# Patient Record
Sex: Female | Born: 1965 | Race: White | Hispanic: No | Marital: Married | Smoking: Never smoker
Health system: Southern US, Community
[De-identification: ages and names within clinical notes are randomized; demographics above are authoritative.]

## PROBLEM LIST (undated history)

## (undated) DIAGNOSIS — K769 Liver disease, unspecified: Secondary | ICD-10-CM

## (undated) DIAGNOSIS — F32A Depression, unspecified: Secondary | ICD-10-CM

## (undated) DIAGNOSIS — A6920 Lyme disease, unspecified: Secondary | ICD-10-CM

## (undated) DIAGNOSIS — K219 Gastro-esophageal reflux disease without esophagitis: Secondary | ICD-10-CM

## (undated) DIAGNOSIS — M1990 Unspecified osteoarthritis, unspecified site: Secondary | ICD-10-CM

## (undated) DIAGNOSIS — F329 Major depressive disorder, single episode, unspecified: Secondary | ICD-10-CM

## (undated) DIAGNOSIS — F419 Anxiety disorder, unspecified: Secondary | ICD-10-CM

## (undated) HISTORY — DX: Liver disease, unspecified: K76.9

## (undated) HISTORY — DX: Depression, unspecified: F32.A

## (undated) HISTORY — PX: TUBAL LIGATION: SHX77

## (undated) HISTORY — PX: FRACTURE SURGERY: SHX138

## (undated) HISTORY — DX: Lyme disease, unspecified: A69.20

## (undated) HISTORY — PX: BREAST SURGERY: SHX581

## (undated) HISTORY — DX: Gastro-esophageal reflux disease without esophagitis: K21.9

## (undated) HISTORY — DX: Unspecified osteoarthritis, unspecified site: M19.90

## (undated) HISTORY — DX: Anxiety disorder, unspecified: F41.9

---
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
## 2007-06-17 HISTORY — PX: BREAST EXCISIONAL BIOPSY: SUR124

## 2014-11-22 ENCOUNTER — Emergency Department: Payer: Commercial Managed Care - POS

## 2014-11-22 ENCOUNTER — Observation Stay: Payer: Commercial Managed Care - POS

## 2014-11-22 ENCOUNTER — Observation Stay: Payer: Commercial Managed Care - POS | Admitting: Internal Medicine

## 2014-11-22 ENCOUNTER — Other Ambulatory Visit: Payer: Commercial Managed Care - POS

## 2014-11-22 ENCOUNTER — Observation Stay
Admission: EM | Admit: 2014-11-22 | Discharge: 2014-11-23 | Disposition: A | Discharge status: Home / Self Care | Payer: Commercial Managed Care - POS | Attending: Internal Medicine | Admitting: Internal Medicine

## 2014-11-22 DIAGNOSIS — R110 Nausea: Secondary | ICD-10-CM | POA: Insufficient documentation

## 2014-11-22 DIAGNOSIS — G44009 Cluster headache syndrome, unspecified, not intractable: Secondary | ICD-10-CM | POA: Diagnosis present

## 2014-11-22 DIAGNOSIS — R51 Headache: Secondary | ICD-10-CM | POA: Insufficient documentation

## 2014-11-22 DIAGNOSIS — G510 Bell's palsy: Principal | ICD-10-CM | POA: Insufficient documentation

## 2014-11-22 DIAGNOSIS — H5702 Anisocoria: Secondary | ICD-10-CM | POA: Insufficient documentation

## 2014-11-22 DIAGNOSIS — K7689 Other specified diseases of liver: Secondary | ICD-10-CM | POA: Insufficient documentation

## 2014-11-22 DIAGNOSIS — R519 Headache, unspecified: Secondary | ICD-10-CM

## 2014-11-22 DIAGNOSIS — R2990 Unspecified symptoms and signs involving the nervous system: Secondary | ICD-10-CM | POA: Diagnosis present

## 2014-11-22 DIAGNOSIS — F32A Depression, unspecified: Secondary | ICD-10-CM | POA: Diagnosis present

## 2014-11-22 DIAGNOSIS — R740 Nonspecific elevation of levels of transaminase and lactic acid dehydrogenase [LDH]: Secondary | ICD-10-CM | POA: Insufficient documentation

## 2014-11-22 LAB — CBC AND DIFFERENTIAL
Basophils Absolute Automated: 0.01 10*3/uL (ref 0.00–0.20)
Basophils Automated: 0 %
Eosinophils Absolute Automated: 0 10*3/uL (ref 0.00–0.70)
Eosinophils Automated: 0 %
Hematocrit: 41.2 % (ref 37.0–47.0)
Hgb: 13.8 g/dL (ref 12.0–16.0)
Immature Granulocytes Absolute: 0 10*3/uL
Immature Granulocytes: 0 %
Lymphocytes Absolute Automated: 0.43 10*3/uL — ABNORMAL LOW (ref 0.50–4.40)
Lymphocytes Automated: 10 %
MCH: 31 pg (ref 28.0–32.0)
MCHC: 33.5 g/dL (ref 32.0–36.0)
MCV: 92.6 fL (ref 80.0–100.0)
MPV: 11.7 fL (ref 9.4–12.3)
Monocytes Absolute Automated: 0.32 10*3/uL (ref 0.00–1.20)
Monocytes: 8 %
Neutrophils Absolute: 3.38 10*3/uL (ref 1.80–8.10)
Neutrophils: 82 %
Platelets: 161 10*3/uL (ref 140–400)
RBC: 4.45 10*6/uL (ref 4.20–5.40)
RDW: 13 % (ref 12–15)
WBC: 4.14 10*3/uL (ref 3.50–10.80)

## 2014-11-22 LAB — LIPID PANEL
Cholesterol / HDL Ratio: 2
Cholesterol: 129 mg/dL (ref 0–199)
HDL: 65 mg/dL (ref >40)
LDL Calculated: 54 mg/dL (ref 0–99)
Triglycerides: 51 mg/dL (ref 34–149)
VLDL Calculated: 10 mg/dL (ref 10–40)

## 2014-11-22 LAB — GLUCOSE WHOLE BLOOD - POCT: Whole Blood Glucose POCT: 123 mg/dL — ABNORMAL HIGH (ref 70–100)

## 2014-11-22 LAB — TROPONIN I
Troponin I: <0.01 ng/mL (ref 0.00–0.09)
Troponin I: <0.01 ng/mL (ref 0.00–0.09)
Troponin I: 0.01 ng/mL (ref 0.00–0.09)

## 2014-11-22 LAB — APTT
PTT: 25 s (ref 23–37)
PTT: 29 s (ref 23–37)

## 2014-11-22 LAB — ECG 12-LEAD
Atrial Rate: 94 {beats}/min
P Axis: 74 degrees
P-R Interval: 138 ms
Q-T Interval: 340 ms
QRS Duration: 72 ms
QTC Calculation (Bezet): 425 ms
R Axis: 26 degrees
T Axis: -10 degrees
Ventricular Rate: 94 {beats}/min

## 2014-11-22 LAB — CBC
Hematocrit: 38.9 % (ref 37.0–47.0)
Hgb: 13 g/dL (ref 12.0–16.0)
MCH: 30.9 pg (ref 28.0–32.0)
MCHC: 33.4 g/dL (ref 32.0–36.0)
MCV: 92.4 fL (ref 80.0–100.0)
MPV: 11.5 fL (ref 9.4–12.3)
Platelets: 158 10*3/uL (ref 140–400)
RBC: 4.21 10*6/uL (ref 4.20–5.40)
RDW: 13 % (ref 12–15)
WBC: 4.33 10*3/uL (ref 3.50–10.80)

## 2014-11-22 LAB — URINALYSIS, REFLEX TO MICROSCOPIC EXAM IF INDICATED
Bilirubin, UA: NEGATIVE
Glucose, UA: NEGATIVE
Ketones UA: NEGATIVE
Leukocyte Esterase, UA: NEGATIVE
Nitrite, UA: NEGATIVE
Protein, UR: NEGATIVE
Specific Gravity UA: 1.01 (ref 1.001–1.035)
Urine pH: 7 (ref 5.0–8.0)
Urobilinogen, UA: NORMAL mg/dL

## 2014-11-22 LAB — COMPREHENSIVE METABOLIC PANEL
ALT: 120 U/L — ABNORMAL HIGH (ref 0–55)
AST (SGOT): 121 U/L — ABNORMAL HIGH (ref 5–34)
Albumin/Globulin Ratio: 1.5 (ref 0.9–2.2)
Albumin: 4.1 g/dL (ref 3.5–5.0)
Alkaline Phosphatase: 116 U/L — ABNORMAL HIGH (ref 37–106)
Anion Gap: 10 (ref 5.0–15.0)
BUN: 8.3 mg/dL (ref 7.0–19.0)
Bilirubin, Total: 0.3 mg/dL (ref 0.2–1.2)
CO2: 25 mEq/L (ref 22–29)
Calcium: 9.4 mg/dL (ref 8.5–10.5)
Chloride: 104 mEq/L (ref 100–111)
Creatinine: 0.9 mg/dL (ref 0.6–1.0)
Globulin: 2.7 g/dL (ref 2.0–3.6)
Glucose: 120 mg/dL — ABNORMAL HIGH (ref 70–100)
Potassium: 3.9 mEq/L (ref 3.5–5.1)
Protein, Total: 6.8 g/dL (ref 6.0–8.3)
Sodium: 139 mEq/L (ref 136–145)

## 2014-11-22 LAB — HEMOLYSIS INDEX: Hemolysis Index: 1 (ref 0–18)

## 2014-11-22 LAB — PT/INR
PT INR: 0.9
PT INR: 1
PT: 12.7 s (ref 12.6–15.0)
PT: 13.3 s (ref 12.6–15.0)

## 2014-11-22 LAB — HCG, SERUM, QUALITATIVE: Hcg Qualitative: NEGATIVE

## 2014-11-22 LAB — SEDIMENTATION RATE: Sed Rate: 17 mm/Hr (ref 0–20)

## 2014-11-22 LAB — GFR: EGFR: >60

## 2014-11-22 MED ORDER — ONDANSETRON HCL 4 MG/2ML IJ SOLN
4.0000 mg | Freq: Three times a day (TID) | INTRAMUSCULAR | Status: DC | PRN
Start: 2014-11-22 — End: 2014-11-24

## 2014-11-22 MED ORDER — ACETAMINOPHEN 325 MG PO TABS
650.0000 mg | ORAL_TABLET | ORAL | Status: DC | PRN
Start: 2014-11-22 — End: 2014-11-24

## 2014-11-22 MED ORDER — ACETAMINOPHEN 650 MG RE SUPP
650.0000 mg | RECTAL | Status: DC | PRN
Start: 2014-11-22 — End: 2014-11-24

## 2014-11-22 MED ORDER — SUMATRIPTAN SUCCINATE 50 MG PO TABS
50.0000 mg | ORAL_TABLET | Freq: Once | ORAL | Status: AC
Start: 2014-11-22 — End: 2014-11-22
  Administered 2014-11-22: 50 mg via ORAL
  Filled 2014-11-22: qty 1

## 2014-11-22 MED ORDER — IOHEXOL 350 MG/ML IV SOLN
100.0000 mL | Freq: Once | INTRAVENOUS | Status: AC | PRN
Start: 2014-11-22 — End: 2014-11-22
  Administered 2014-11-22: 100 mL via INTRAVENOUS

## 2014-11-22 MED ORDER — MORPHINE SULFATE 4 MG/ML IJ/IV SOLN (WRAP)
4.0000 mg | Freq: Once | Status: AC
Start: 2014-11-22 — End: 2014-11-22
  Administered 2014-11-22: 4 mg via INTRAVENOUS
  Filled 2014-11-22: qty 1

## 2014-11-22 MED ORDER — PROMETHAZINE HCL 25 MG/ML IJ SOLN
12.5000 mg | Freq: Once | INTRAMUSCULAR | Status: AC
Start: 2014-11-22 — End: 2014-11-22
  Administered 2014-11-22: 12.5 mg via INTRAVENOUS
  Filled 2014-11-22: qty 1

## 2014-11-22 MED ORDER — GADOBUTROL 1 MMOL/ML IV SOLN
10.0000 mL | Freq: Once | INTRAVENOUS | Status: AC | PRN
Start: 2014-11-22 — End: 2014-11-22
  Administered 2014-11-22: 10 mmol via INTRAVENOUS

## 2014-11-22 MED ORDER — ACETAMINOPHEN 325 MG PO TABS
650.0000 mg | ORAL_TABLET | ORAL | Status: DC | PRN
Start: 2014-11-22 — End: 2014-11-22

## 2014-11-22 MED ORDER — ONDANSETRON HCL 4 MG/2ML IJ SOLN
4.0000 mg | Freq: Once | INTRAMUSCULAR | Status: DC | PRN
Start: 2014-11-22 — End: 2014-11-24

## 2014-11-22 MED ORDER — ASPIRIN 81 MG PO CHEW
81.0000 mg | CHEWABLE_TABLET | Freq: Every day | ORAL | Status: DC
Start: 2014-11-23 — End: 2014-11-24
  Administered 2014-11-23 (×2): 81 mg via ORAL
  Filled 2014-11-22 (×2): qty 1

## 2014-11-22 MED ORDER — ONDANSETRON 4 MG PO TBDP
4.0000 mg | ORAL_TABLET | Freq: Three times a day (TID) | ORAL | Status: DC | PRN
Start: 2014-11-22 — End: 2014-11-24

## 2014-11-22 MED ORDER — VENLAFAXINE HCL ER 37.5 MG PO CP24
75.0000 mg | ORAL_CAPSULE | Freq: Every day | ORAL | Status: DC
Start: 2014-11-22 — End: 2014-11-24
  Administered 2014-11-22: 75 mg via ORAL
  Filled 2014-11-22 (×4): qty 2

## 2014-11-22 MED ORDER — ZOLPIDEM TARTRATE 5 MG PO TABS
5.0000 mg | ORAL_TABLET | Freq: Every evening | ORAL | Status: DC | PRN
Start: 2014-11-22 — End: 2014-11-24

## 2014-11-22 MED ORDER — SODIUM CHLORIDE 0.9 % IV SOLN
INTRAVENOUS | Status: DC
Start: 2014-11-22 — End: 2014-11-23

## 2014-11-22 MED ORDER — IOHEXOL 240 MG/ML IJ SOLN
50.0000 mL | Freq: Once | INTRAMUSCULAR | Status: DC | PRN
Start: 2014-11-22 — End: 2014-11-22

## 2014-11-22 MED ORDER — SODIUM CHLORIDE 0.9 % IV SOLN
INTRAVENOUS | Status: AC
Start: 2014-11-22 — End: 2014-11-23

## 2014-11-22 MED ORDER — GADOBUTROL 1 MMOL/ML IV SOLN
10.0000 mL | Freq: Once | INTRAVENOUS | Status: DC | PRN
Start: 2014-11-22 — End: 2014-11-24

## 2014-11-22 NOTE — Consults (Signed)
Service Date: 11/22/2014     Patient Type: V     CONSULTING PHYSICIAN: Everlean Patterson MD     REFERRING PHYSICIAN:      HISTORY OF PRESENT ILLNESS:  The patient is a 49 year old I was asked to see in consultation for  neurological evaluation.  The patient presented with headache, also she  stated that she was feeling fatigued and sore, tiredness.  She does notice  an asymmetric pupil.  The headache was primarily left-sided frontotemporal.   These headaches were gradual onset and got worse.  In the morning, the  patient noticed that her right pupil was much larger than the left pupil.   She denies any double vision.  She had mild nausea, mild photophobia, but  no vomiting.  She stated she is feeling achy with some chills for the past  couple of days.  She denies any definite tick bites.     She denies any numbness, weakness of extremities.     PAST MEDICAL HISTORY:  Positive for some headache off and on, but according to patient, these  headaches are quite different.     PAST SURGICAL HISTORY:  Denies any major surgery.     FAMILY HISTORY:  No neurological disorder.     SOCIAL HISTORY:  Denies any smoking, drinking, drugs, or alcohol use.     REVIEW OF SYSTEMS:  Fatigue with some soreness and achy feeling, some myalgias.  Headache.  No  double vision.  No loss of consciousness.  No convulsions, seizure.     PHYSICAL EXAMINATION:  VITAL SIGNS:  Blood pressure was 140/66, heart rate was 92, temperature was  97, respiratory rate 16.    GENERAL:  Otherwise, very pleasant lady who is awake, alert, and oriented  in time, person and place.  Somewhat in distress because of the headache  which is primarily left frontotemporal.    NEUROLOGICAL:  Speech was clear.  Comprehension was normal.  CARDIOVASCULAR:  Normal first and second heart sounds.  CHEST:  Chest moves symmetrical.  ABDOMEN:  Soft.    NECK:  No carotid bruit, no neck stiffness.     Right pupil was about 5 mm, left about 3 mm.  Both pupils, however, were  reactive to  light.  Mild droopiness of left eyelid.  Otherwise, no  nystagmus.  Extraocular movements are fully intact.  No facial weakness.   No other cranial nerve deficit.     DIAGNOSTIC STUDIES:  EKG:  Normal sinus rhythm.  White cell count 4.3, hemoglobin 13, hematocrit  38, platelets 158.     Cholesterol 129, HDL 65, triglycerides 51, troponin less than 0.01.   Sedimentation rate is 17.  PT 13, INR 1, PTT 29.  Reviewed a CT scan of the  head.  There was a small area of hypodensity reported in the posterior pons  on the right side.  This was nonspecific.  CTA was done to rule out an  aneurysm.  There was only 1 mm outpouching extending superiorly from  anterior communicating artery.  No posterior communicating artery aneurysm  seen.     This was followed by MRI of the brain, which is negative.  MRA was also  done, which is negative, and MR angiogram.     ASSESSMENT AND PLAN:  A 49 year old with the above-mentioned symptoms.  Mild asymmetric pupil,  left smaller than the right, both react.  Extraocular movements intact.   Some soreness, achy feeling, not sure if some viral syndrome.  No  definitive tick, but may check Lyme.  Otherwise, no evidence of any  significant aneurysm.  Most likely she is suffering from complicated  migraine or cluster migraine headache that may benefit from triptan at this  time.  Otherwise, she appears stable from neurological standpoint, consider  outpatient followup.           D:  11/22/2014 21:03 PM by Dr. Br[place] ([id number])  T:  11/22/2014 21:39 PM by       Everlean Cherry: [id number]) (Doc ID: [id number])

## 2014-11-22 NOTE — Plan of Care (Signed)
Problem: Safety  Goal: Patient will be free from injury during hospitalization  Outcome: Progressing  Pt verbalized understanding to use call bell before getting oob, non skid socks in place, call bell within reach.

## 2014-11-22 NOTE — ED Notes (Signed)
BG 123

## 2014-11-22 NOTE — Discharge Summary -  Nursing (Signed)
Ask3Teach3 Program    Education about New Medications and their Side effects    Dear Nicole Holder,    Its been a pleasure taking care of you during your hospitalization here at [facility]. We have initiated a new program to educate our patients and/or their family members or designated personnel about the new medications started by your physicians and their indications along with the possible side effects. Multiple studies have shown that patients started on new medications are often unaware of the names of the medication along with the indications and their side effects which leads to decreased compliance with the medications.    During our conversation today on 11/22/2014  4:04 PM I have explained to you the name of the new medication and the indication along with some possible common side effects. Listed below are some of the new medications started during this hospitalization.     Please call the Nurse if you have any side effects while in hospital.     Please call [phone] if you have any life threatening symptoms after you are discharged from the hospital.    Please inform your Primary care physician for common side effects which are not life threatening after discharge.    Medication: Levofloxacin(Levaquin)   This Medication is used for:   Bacterial Infections    Common Side Effects are:   Nausea   Diarrhea   Upset Stomach    A note from your nurse:  Call your nurse immediately if you notice itching, hives, swelling or trouble breathing       Thank you for your time.    Vaughan Sine, RN  11/22/2014  4:04 PM  [facility]  [address]  [place], [place]  [address]

## 2014-11-22 NOTE — ED Provider Notes (Signed)
Physician/Midlevel provider first contact with patient: 11/22/14 1610         History   No chief complaint on file.    HPI Comments: 49 year old female presents to the emergency department complaining of a left-sided frontal headache since yesterday.  Gradual onset.  Gradually worse.  This morning she noticed that her right pupil was much larger than her left pupil.  The pain is 4 out of 10.  She said she never gets headaches.  She has mild nausea.  Mild light sensitivity.  No vomiting.  She has had chills and felt achy for 2 days.  She says they also rescued a cardinal 4 days ago, but had no bite or other exposure to it.  No fevers or chills.  No weakness or numbness.    The history is provided by the patient and the spouse.            Past Medical History   Diagnosis Date   . Disorder of liver    . Depression        History reviewed. No pertinent past surgical history.    Family History   Problem Relation Age of Onset   . Cancer Mother        Social  History   Substance Use Topics   . Smoking status: Never Smoker    . Smokeless tobacco: Never Used   . Alcohol Use: No      Comment: social       .     No Known Allergies    Home Medications     Last Medication Reconciliation Action:  Pharmacy Completed [facility] Shown 11/22/2014  1:30 PM          Status Comment           11/22/2014  1:30 PM      Pharmacy Reviewed                      calcium carbonate (TUMS) 500 MG chewable tablet     Chew 2 tablets by mouth daily.     Cholecalciferol (VITAMIN D3 PO)     Take 6,000 mg by mouth daily.     Magnesium 500 MG Tab     Take 1 tablet by mouth daily.     venlafaxine (EFFEXOR-XR) 75 MG 24 hr capsule     Take 75 mg by mouth daily.     zolpidem (AMBIEN) 5 MG tablet     Take 5 mg by mouth nightly as needed for Sleep.                               Review of Systems   Constitutional: Positive for chills and fatigue. Negative for fever.   HENT: Negative for congestion, rhinorrhea and sore throat.    Respiratory: Negative for  cough, chest tightness and shortness of breath.    Cardiovascular: Negative for chest pain and palpitations.   Gastrointestinal: Negative for nausea, vomiting, abdominal pain and diarrhea.   Genitourinary: Negative for dysuria and frequency.   Musculoskeletal: Positive for myalgias. Negative for back pain.   Skin: Negative for color change and rash.   Neurological: Positive for headaches. Negative for dizziness.   Psychiatric/Behavioral: Negative for confusion. The patient is not nervous/anxious.        Physical Exam    BP: 141/66 mmHg, Heart Rate: 92, Temp: 97.3 F (36.3 C), Resp Rate: 16, SpO2: 100 %,  Weight: 72.576 kg    Physical Exam   Constitutional: She is oriented to person, place, and time. She appears well-developed and well-nourished.   HENT:   Head: Normocephalic and atraumatic.   Eyes: Conjunctivae and EOM are normal.   Right pupil approximately 8 mm, left pupil approximately 4 mm, both responsive directly and indirectly   Neck: Normal range of motion. Neck supple.   Cardiovascular: Normal rate, regular rhythm and normal heart sounds.    Pulmonary/Chest: Effort normal and breath sounds normal. No respiratory distress. She has no wheezes. She has no rales.   Abdominal: Soft. She exhibits no distension. There is no tenderness. There is no rebound and no guarding.   Musculoskeletal: Normal range of motion. She exhibits no edema or tenderness.   Neurological: She is alert and oriented to person, place, and time. No cranial nerve deficit.   Skin: Skin is warm and dry.   Psychiatric: She has a normal mood and affect. Her behavior is normal. Judgment and thought content normal.   Nursing note and vitals reviewed.        MDM and ED Course     ED Medication Orders     Start Ordered     Status Ordering Provider    11/22/14 1059 11/22/14 1059  morphine injection 4 mg   Once     Route: Intravenous  Ordered Dose: 4 mg     Last MAR action:  Given Laykin Rainone H    11/22/14 1059 11/22/14 1059  promethazine (PHENERGAN)  injection 12.5 mg   Once     Route: Intravenous  Ordered Dose: 12.5 mg     Last MAR action:  Given Jacee Enerson H             MDM  Number of Diagnoses or Management Options  Anisocoria:   New onset headache:   Diagnosis management comments: Differential diagnosis: Bleed, stroke, mass, cranial nerve injury  Plan: Stat CAT scan, labs, admission for MRI    I, Nita Sells, M.D, have been the primary provider for this patient during this Emergency Dept visit.    Oxygen saturation by pulse oximetry is 95%-100%, Normal.  Interventions: None Needed    EKG Interpretation by Nita Sells, MD, ED physician:  Rate:  Normal  Rhythm:  Normal Sinus  Axis:  Normal  Conduction:  No blocks  ST Segments:  Non specific   Other findings:  none  Q Waves:  v1 v2  Clinical Impression:  Non-specific EKG  Comparison to old ECG none    I had a stat d/w Dr. Marney Doctor from neuorlogy who wanted a stat CTA    I d/w neuro-radiologist. MRI available in likely one hour but by the time I got results, would likely be 3 hour delay. Will do immediate cta and if neg, will admit for MRI. Neurorads suggests no neck cta and wait for MRA of neck.    I d/w Dr. Marney Doctor the CTA results. He saw her in the ER. He thinks she is likely having an atypical migraine. Agrees with pain meds and admission for MRI. He feels cta findings do not explain the anisocoria.    I d/w hospitalist who will admit for MRI. Pt improved.         Amount and/or Complexity of Data Reviewed  Clinical lab tests: ordered and reviewed  Tests in the radiology section of CPT: ordered and reviewed  Obtain history from someone other than the patient: yes (husband)  Discuss the patient  with other providers: yes (See above)    Risk of Complications, Morbidity, and/or Mortality  Presenting problems: high  Diagnostic procedures: high  Management options: high    Critical Care  Total time providing critical care: 30-74 minutes    Patient Progress  Patient progress: improved            Stroke/tPA Management  Date/Time:  11/24/2014 11:58 AM  Performed by: Nita Sells H  Authorized by: Zada Finders  Stroke/tPA: Best estimate of time since last normal exceeds the recommended window for tPA treatment..  Critical Care  Performed by: Leticia Clas  Authorized by: Zada Finders    Critical care provider statement:     Critical care time (minutes):  35    Critical care time was exclusive of:  Separately billable procedures and treating other patients    Critical care was necessary to treat or prevent imminent or life-threatening deterioration of the following conditions:  CNS failure or compromise    Critical care was time spent personally by me on the following activities:  Obtaining history from patient or surrogate, development of treatment plan with patient or surrogate, discussions with consultants, evaluation of patient's response to treatment, examination of patient, ordering and performing treatments and interventions, ordering and review of laboratory studies, ordering and review of radiographic studies and re-evaluation of patient's condition      Clinical Impression & Disposition     Clinical Impression  Final diagnoses:   Anisocoria   New onset headache        ED Disposition     Observation Admitting Physician: ADI REDDY, SWAPNA [[id number]]  Diagnosis: Stroke-like symptoms [711733]  Estimated Length of Stay: < 2 midnights  Tentative Discharge Plan?: Home or Self Care [1]  Patient Class: Observation [104]             Discharge Medication List as of 11/23/2014  7:25 PM      START taking these medications    Details   acyclovir (ZOVIRAX) 800 MG tablet Take 1 tablet (800 mg total) by mouth 2 (two) times daily., Starting 11/23/2014, Until Tue 11/28/14, Print      ondansetron (ZOFRAN) 4 MG tablet Take 1 tablet (4 mg total) by mouth every 8 (eight) hours as needed for Nausea., Starting 11/23/2014, Until Discontinued, Print      pantoprazole (PROTONIX) 40 MG tablet Take 1 tablet (40 mg total) by mouth daily., Starting 11/23/2014, Until Sun 12/03/14,  Print      predniSONE (DELTASONE) 10 MG tablet Take 6 tablets for 5 days then 5 tabs for 1 day then 4 tabs for 1 day then 3 tabs for 1 day then 2 tabs for 1 day then 1 tab for 1 day., Print      SUMAtriptan (IMITREX) 25 MG tablet Take 1 tablet (25 mg total) by mouth every 6 (six) hours as needed for Migraine., Starting 11/23/2014, Until Discontinued, Print                         Leticia Clas, MD  11/24/14 [phone]

## 2014-11-22 NOTE — Plan of Care (Signed)
Problem: Health Promotion  Goal: Vaccination Screening  All patients will be screened for current vaccination status on each admission.   Outcome: Progressing  Goal: Knowledge - disease process  Extent of understanding conveyed about a specific disease process.   Outcome: Progressing  Goal: Risk control - tobacco abuse  Actions to eliminate or reduce tobacco use.   Outcome: Progressing  Goal: Knowledge - health resources  Extent of understanding and conveyed about healthcare resources.   Outcome: Progressing    Problem: Safety  Goal: Patient will be free from injury during hospitalization  Outcome: Progressing    Problem: Pain  Goal: Patient's pain/discomfort is manageable  Outcome: Progressing    Problem: Psychosocial and Spiritual Needs  Goal: Demonstrates ability to cope with hospitalization/illness  Outcome: Progressing    Problem: Day of Admission- Stroke  Goal: Neurological status is stable or improving  Outcome: Progressing  Goal: Stable vital signs and fluid balance  Outcome: Progressing  Goal: Patient will maintain Adequate Oxygenation  Outcome: Progressing  Goal: Patients risk of aspiration will be minimized  Outcome: Progressing  Goal: Nutritional Status Improving  Outcome: Progressing  Goal: Elimination patterns are normal or improving  Outcome: Progressing  Goal: Mobility/activity is maintained at optimum level for patient  Outcome: Progressing  Goal: Skin integrity is maintained or improved  Outcome: Progressing  Goal: Neurovascular Status is Stable or Improving  Outcome: Progressing  Goal: Effective coping demonstrated  Outcome: Progressing  Goal: Will be able to express needs and understand communication  Outcome: Progressing  Goal: Patient/Patient Care Companion demonstrates understanding on disease process, treatment plan, medications and discharge plan and consequences of noncompliance.  Outcome: Progressing

## 2014-11-22 NOTE — H&P (Signed)
[facility] HOSPITALIST  H&P    Patient Info:   Date Time: 11/22/2014  2:14 PM   Patient Name:Nicole Holder   ZOX:[id number]    PCP: Moishe Spice, MD   Admit Date:11/22/2014   Attending Physician:Adi Viona Gilmore, MD      Assessment and Plan:   1. Strokelike symptoms, headache, and anisocoria.  CT scan of the head  showed a small hypodensity in the posterior pons on the right side. Nonspecific and could represent a small chronic infarction. For this reason, a CTA of the head was performed. The CT angiogram shows that the previously. Question abnormality in the right posterior pons does not demonstrate enhancement, nonspecific and could be artifactual. However, there is a 1 millimeter outpouching suspicious for small aneurysm or an infundibulum at the takeoff of a vessel which is below CT angiographic resolution. The proximal visualized portion of the anterior, middle and posterior cerebral arteries are otherwise unremarkable.   Will check MRI of the head with and without contrast along with MRA of the neck .  Stroke protocol   Neurology consultation, Dr. Marney Doctor.  Telemetry monitoring   Check sedimentation rate  Aspirin therapy.  Check lipids.  Check hemoglobin A1c    2. Transaminitis , acute .  Patient reports full physical in December and labs were normal.  Check abdominal ultrasound, specifically liver.     3. Depression, chronic and well controlled.  Continue Effexor .       Hospital Problems:  Principal Problem:    Stroke-like symptoms  Active Problems:    Anisocoria    New onset headache    Depression     DVT Prohylaxis:SEDs and ambulating    Code Status: Full Code   Disposition:home   Condition on admission and Prognosis: Stable    Type of Admission:Observation   Estimated Length of Stay (including stay in the ER receiving treatment): Less than 2 midnights    Medical Necessity for stay: Strokelike symptoms , anisocoria and headache        Clinical Presentation   History of Presenting Illness:    Nicole Holder is a 49 y.o. female who has history of History reviewed. No pertinent past surgical history. Past Medical History   Diagnosis Date   . Disorder of liver    . Depression       Mr.  Gamarra is a very pleasant 49 year old Caucasian female with past medical history of depression who presents with strokelike symptoms. The patient reports that she began her menstrual cycle, 3 days ago and around the same time developed a frontal headache. The headache has been ongoing despite taking naproxen and this morning she woke up with unequal pupils. She has never experienced symptoms like this before and is typically in a good state of health. She has no other neurologic symptoms and presents for evaluation of unequal pupils. The patient was found to have incidental elevated liver enzymes and reports that she was seen by her primary care physician in December for routine physical. At that time all of her labs were good.    She denies difficulty communicating, ambulating, and denies dizziness, lightheadedness, visual changes , gait abnormality , chest pain, shortness of breath, abdominal pain, nausea, vomiting, diarrhea, constipation, or urinary symptoms. She is not taking any over-the-counter medications , does not smoke and drinks alcohol only socially.      Review of Systems:    Items that are highlighted in BOLD are positive:     Constitutional: negative for  anorexia, chills, fatigue, fevers, malaise, night sweats, sweats and weight loss  Eyes: Anisocoria negative for cataracts, color blindness, contacts/glasses, glaucoma, icterus, irritation, redness and visual disturbance, photophobia  Ears, nose, mouth, throat, and face: negative for ear drainage, earaches, epistaxis, facial trauma, hearing loss, hoarseness, nasal congestion, snoring, sore mouth, sore throat, tinnitus and voice change, dysphagia, no hearing devices in use.   Respiratory: negative for asthma, chronic bronchitis, cough, dyspnea on exertion,  emphysema, hemoptysis, pleurisy/chest pain, pneumonia, sputum, stridor and wheezing, prior intubations  Cardiovascular: negative for chest pain, chest pressure/discomfort, claudication, dyspnea, exertional chest pressure/discomfort, fatigue, irregular heart beat, lower extremity edema, near-syncope, orthopnea, palpitations, paroxysmal nocturnal dyspnea, exercise intolerance syncope and tachypnea  Gastrointestinal: negative for abdominal pain, change in bowel habits, constipation, diarrhea, dyspepsia, dysphagia, jaundice, melena, nausea, odynophagia, reflux symptoms, hematochezia and vomiting  Genitourinary: negative for decreased stream, dysuria, frequency, hematuria, hesitancy, nocturia and urinary incontinence  Hematologic/lymphatic: negative for bleeding, easy bruising, lymphadenopathy and petechiae  Musculoskeletal: negative for arthralgias, back pain, bone pain, muscle weakness, myalgias, neck pain and stiff joints  Neurological: negative for coordination problems, dizziness, gait problems, headaches, memory problems, paresthesia, seizures, speech problems, tremors, vertigo and weakness  Behavioral/Psych: negative for abusive relationship, anxiety, depression, excessive alcohol consumption, fatigue, illegal drug usage, mood swings and tobacco use  Endocrine: negative for diabetic symptoms including blurry vision, increased fatigue, polydipsia, polyphagia, polyuria, poor wound healing, pruritus, skin dryness and weight loss and temperature intolerance  Allergic/Immunologic: negative for anaphylaxis, angioedema, hay fever and urticaria       Vitals:   Vitals reviewed height is 1.702 m (5\' 7" ) and weight is 72.576 kg (160 lb). Her temporal artery temperature is 98 F (36.7 C). Her blood pressure is 123/65 and her pulse is 92. Her respiration is 18 and oxygen saturation is 99%. Body mass index is 25.05 kg/(m^2).  Filed Vitals:    11/22/14 1214 11/22/14 1230 11/22/14 1300 11/22/14 1400   BP: 124/70 121/70 113/69  123/65   Pulse: 86 84 90 92   Temp:    98 F (36.7 C)   TempSrc:    Temporal Artery   Resp: 17 17 17 18    Height:    1.702 m (5\' 7" )   Weight:    72.576 kg (160 lb)   SpO2: 98% 99% 97% 99%     Intake and Output Summary (Last 24 hours) at Date Time No intake or output data in the 24 hours ending 11/22/14 1414   Physical Exam:     Items that are highlighted in BOLD are positive:     General Appearance:    Alert, cooperative, no distress, appears stated age   Head:    Normocephalic, without obvious abnormality, atraumatic   Eyes:    anisocoria; right pupil is approximately 5 millimeters while the left is approximately 3 millimeters. Post respond equally to light and accommodation. conjunctiva/corneas clear, EOM's intact,   Ears:    Normal  external ear canals bilaterally. No obvious hearing loss.   Nose:   Nares normal, septum midline, mucosa normal, no drainage    or sinus tenderness   Throat:   Lips, mucosa, and tongue normal; teeth and gums normal   Neck:   Supple, symmetrical, trachea midline, no adenopathy;     thyroid:  no enlargement/tenderness/nodules; no carotid    bruit or JVD   Back:     Symmetric, no curvature, ROM normal, no CVA tenderness   Lungs:     Clear to auscultation bilaterally, respirations  unlabored   Chest [place]:    No tenderness or deformity    Heart:    Regular rate and rhythm, S1 and S2 normal, no murmur, rub   or gallop   Abdomen:     Soft, non-tender, bowel sounds active all four quadrants,     no masses, no organomegaly   Extremities:   Extremities normal, atraumatic, no cyanosis or edema   Pulses:   2+ and symmetric all extremities   Skin:   Skin color, texture, turgor normal, no rashes or lesions   Lymph nodes:   Cervical, supraclavicular, and axillary nodes normal   Neurologic:   CNII-XII intact, normal strength, sensation and reflexes     throughout              Clinical Information   Chief Complaint:No chief complaint on file.     Past Medical History:  Past Medical History    Diagnosis Date   . Disorder of liver    . Depression       Past Surgical History:History reviewed. No pertinent past surgical history.   Family History:  Family History   Problem Relation Age of Onset   . Cancer Mother       Social History:  History   Alcohol Use No     Comment: social     History   Drug Use No     History   Smoking status   . Never Smoker    Smokeless tobacco   . Never Used     History     Social History   . Marital Status: Married     Spouse Name: N/A   . Number of Children: N/A   . Years of Education: N/A     Social History Main Topics   . Smoking status: Never Smoker    . Smokeless tobacco: Never Used   . Alcohol Use: No      Comment: social   . Drug Use: No   . Sexual Activity: Not on file     Other Topics Concern   . None     Social History Narrative   . None      Allergies:No Known Allergies   Medications:  Prescriptions prior to admission   Medication Sig Dispense Refill Last Dose   . calcium carbonate (TUMS) 500 MG chewable tablet Chew 2 tablets by mouth daily.   11/21/2014 at 2100   . Cholecalciferol (VITAMIN D3 PO) Take 6,000 mg by mouth daily.   11/21/2014 at 2100   . Magnesium 500 MG Tab Take 1 tablet by mouth daily.   11/21/2014 at 2100   . naproxen sodium (ANAPROX) 550 MG tablet Take 550 mg by mouth as needed (for cramps).   11/21/2014 at 1400   . Probiotic Product (PROBIOTIC DAILY PO) Take 1 capsule by mouth daily.   11/21/2014 at 2100   . venlafaxine (EFFEXOR-XR) 75 MG 24 hr capsule Take 75 mg by mouth daily.   11/21/2014 at 2100   . zolpidem (AMBIEN) 5 MG tablet Take 5 mg by mouth nightly as needed for Sleep.   11/21/2014 at 2100          Results of Labs/imaging   Labs have been reviewed:   Coagulation Profile:   Recent Labs  Lab 11/22/14  0937   PT 12.7   PT INR 0.9   PTT 25        CBC review:   Recent Labs  Lab 11/22/14  0803  WBC 4.14   HGB 13.8   HEMATOCRIT 41.2   PLATELETS 161   MCV 92.6   RDW 13   NEUTROPHILS 82   LYMPHOCYTES AUTOMATED 10   EOSINOPHILS AUTOMATED 0   IMMATURE GRANULOCYTE  0   NEUTROPHILS ABSOLUTE 3.38   ABSOLUTE IMMATURE GRANULOCYTE 0.00      Chem Review:  Recent Labs  Lab 11/22/14  0937   SODIUM 139   POTASSIUM 3.9   CHLORIDE 104   CO2 25   BUN 8.3   CREATININE 0.9   GLUCOSE 120*   CALCIUM 9.4   BILIRUBIN, TOTAL 0.3   AST (SGOT) 121*   ALT 120*   ALKALINE PHOSPHATASE 116*      Results     Procedure Component Value Units Date/Time    UA with reflex to micro (all hospital ED's and [facility]) [[id number]]  (Abnormal) Collected:  11/22/14 1047    Specimen Information:  Urine Updated:  11/22/14 1106     Urine Type Clean Catch      Color, UA Yellow      Clarity, UA Clear      Specific Gravity UA 1.010      Urine pH 7.0      Leukocyte Esterase, UA Negative      Nitrite, UA Negative      Protein, UR Negative      Glucose, UA Negative      Ketones UA Negative      Urobilinogen, UA Normal mg/dL      Bilirubin, UA Negative      Blood, UA Moderate (A)      RBC, UA 6-10 (A) /hpf      WBC, UA 0-5 /hpf      Squamous Epithelial Cells, Urine 0-5 /hpf      Urine Mucus Present     Troponin I [[id number]] Collected:  11/22/14 0937    Specimen Information:  Blood Updated:  11/22/14 1022     Troponin I <0.01 ng/mL     Comprehensive metabolic panel [[id number]]  (Abnormal) Collected:  11/22/14 0937    Specimen Information:  Blood Updated:  11/22/14 1022     Glucose 120 (H) mg/dL      BUN 8.3 mg/dL      Creatinine 0.9 mg/dL      Sodium 086 mEq/L      Potassium 3.9 mEq/L      Chloride 104 mEq/L      CO2 25 mEq/L      Calcium 9.4 mg/dL      Protein, Total 6.8 g/dL      Albumin 4.1 g/dL      AST (SGOT) 578 (H) U/L      ALT 120 (H) U/L      Alkaline Phosphatase 116 (H) U/L      Bilirubin, Total 0.3 mg/dL      Globulin 2.7 g/dL      Albumin/Globulin Ratio 1.5      Anion Gap 10.0     GFR [[id number]] Collected:  11/22/14 0937     EGFR >60.0 Updated:  11/22/14 1022    Protime-INR [[id number]] Collected:  11/22/14 0937    Specimen Information:  Blood Updated:  11/22/14 1010     PT 12.7 sec      PT INR 0.9       PT Anticoag. Given Within 48 hrs. None     APTT [[id number]] Collected:  11/22/14 0937     PTT 25 sec Updated:  11/22/14 1010  Beta HCG, Qual, Serum [[id number]] Collected:  11/22/14 0937    Specimen Information:  Blood Updated:  11/22/14 1010     Hcg Qualitative Negative     CBC with differential [[id number]]  (Abnormal) Collected:  11/22/14 0803    Specimen Information:  Blood from Blood Updated:  11/22/14 0958     WBC 4.14 x10 3/uL      Hgb 13.8 g/dL      Hematocrit 78.2 %      Platelets 161 x10 3/uL      RBC 4.45 x10 6/uL      MCV 92.6 fL      MCH 31.0 pg      MCHC 33.5 g/dL      RDW 13 %      MPV 11.7 fL      Neutrophils 82 %      Lymphocytes Automated 10 %      Monocytes 8 %      Eosinophils Automated 0 %      Basophils Automated 0 %      Immature Granulocyte 0 %      Neutrophils Absolute 3.38 x10 3/uL      Abs Lymph Automated 0.43 (L) x10 3/uL      Abs Mono Automated 0.32 x10 3/uL      Abs Eos Automated 0.00 x10 3/uL      Absolute Baso Automated 0.01 x10 3/uL      Absolute Immature Granulocyte 0.00 x10 3/uL     Glucose Whole Blood - POCT [[id number]]  (Abnormal) Collected:  11/22/14 0931     POCT - Glucose Whole blood 123 (H) mg/dL Updated:  57/84/69 6295         Radiology reports have been reviewed:  Radiology Results (24 Hour)     Procedure Component Value Units Date/Time    CT Angiogram Head [[id number]] Collected:  11/22/14 1054    Order Status:  Completed Updated:  11/22/14 1105    Narrative:      History: Headaches, right pupil dilatation.    FINDINGS: Following administration of intravenous contrast contiguous  axial images were obtained through the head according to a CT angiogram  protocol. Then delayed contrast enhanced images of the brain were  obtained. Total of 100 cc of Omnipaque 350 was utilized. Multiplanar and  maximum intensity projection images of the CT angiogram of the head were  performed. 3-D rendering was performed on an independent workstation.  Correlation with a brain CT performed  earlier the same day.    The contrast-enhanced brain CT demonstrates no abnormal enhancement. The  previously seen small oval density in the right posterior pons appears  somewhat linear on the current examination. It is nonspecific, could be  artifactual.    The intracranial internal carotid arteries are normal appearing. The  basilar artery is normal in caliber. The right A1 segment appears  congenitally smaller. The anterior communicating artery appears  minimally bulbous. There is a tiny outpouching extending superiorly from  its right aspect measuring approximately 1 mm suspicious for a very  small aneurysm or an infundibulum at the takeoff of a vessel which is  below CT angiographic resolution. The proximal visualized portions of  the anterior, middle and posterior cerebral arteries are otherwise  unremarkable. The posterior communicating arteries are small as the  posterior cerebral arteries are supplied mostly from the basilar artery.      Impression:      Impression:  1. The previously questioned abnormality in the right  posterior pons  does not demonstrate enhancement. It is nonspecific. Please see the  discussion above.  2. There is a 1 mm outpouching extending superiorly from the anterior  communicating artery complex which could represent a tiny aneurysm or an  infundibulum at the origin of the vessel which is below CT angiographic  resolution. No posterior communicating artery aneurysm is seen.    Terrilee Croak, MD   11/22/2014 11:01 AM      Chest AP Portable [[id number]] Collected:  11/22/14 1008    Order Status:  Completed Updated:  11/22/14 1012    Narrative:      Chest x-ray    HISTORY: Stroke evaluation    Comparison: None    Frontal view of the chest obtained. No focal consolidation within the  lungs. Pulmonary vasculature within normal limits. No definite pleural  effusion or pneumothorax. Cardia mediastinal contour unremarkable.      Impression:       No acute finding.    Sallye Lat, MD    11/22/2014 10:08 AM      CT Head WO Contrast [[id number]] Collected:  11/22/14 0918    Order Status:  Completed Updated:  11/22/14 0925    Narrative:      History: Headache, dilated right pupil.    Findings: Brain CT without intravenous contrast. No comparison studies.    There is a small oval hypodensity in the posterior portion of the right  pons. It is nonspecific, could represent a small chronic infarction.  There is no mass effect, acute intracranial hemorrhage. The ventricular  system and cisterns are normally configured. The visualized paranasal  sinuses and calvarium are unremarkable.      Impression:      Impression: There is a small hypodensity in the posterior pons on the  right side. It is nonspecific, could represent a small chronic  infarction. If clinically indicated MRI could be performed for better  evaluation.    Terrilee Croak, MD   11/22/2014 9:21 AM           EKG: EKG reviewed , sinus rhythm at 94 beats per minute with nonspecific T-wave abnormality. We'll repeat EKG    Procedures:Procedures       Hospitalist   Signed by: Oswald Hillock   11/22/2014 2:14 PM

## 2014-11-22 NOTE — Student Medication History (Signed)
Medication History Interview and Review                                              11/22/2014  1:35 PM    The Best Possible Medication History was completed for [facility] 04/04.    Patient Allergy LIst has been Verified  Patient Medication List has been Updated    Corrections to Home Medications and Allergies      The following ALLERGY and INTOLERANCE clarifications were documented:  N/A    The following Medications were ADDED to the Home Medication List:  Per the patient:  Ambien 10mg , 1/2 tablet nightly   Venlafaxine 75mg  ER capsule, 1 capsule daily  Calcium carbonate 500mg , 2 tablets daily  Vitamin D 6000IU, 1 tablet daily   Magnesium 500mg , 1 tablet daily   Probiotic, 1 capsule daily   Naproxen 550mg , PRN for cramps     The following Medications were REMOVED from the Home Medication List:              N/A    The following Medications required DOSE, FREQUENCY, or FORMULATION changes:  N/A      Medication Knowledge,  Barriers, and Comments     Patients medications are administered by: Self      Patient reports his/her adherence during a normal week as approximately: 100 % of medications taken    Patient reports the following FINANCIAL barriers to medication adherence:  N/A    Patient reports the following LOGISTIC or OTHER barriers to medication adherence:  N/A    ADDITIONAL COMMENTS:   Patient states she took 4 tablets of the naproxen 550mg  yesterday (11/21/14) with her last dose of it around 2pm.     On a scale of 1-5 (5 being very knowledgeable and 1 being completely unaware), what was the patient/caregiver level of familiarity with their home medications?    X  Patient     Caregiver:     X  5 Very knowledgeable; Patient can list all home medications with doses, frequencies, and routes; can give indications.     4 Knowledgeable;  Patient can list all medications and most doses and frequencies; can give indications.     3 Somewhat knowledgeable; Patient can list most medications; may or may not know doses  or indications.     2 Unaware;  Patient can list some medications or knows some indications.     1 Completely Unaware;  Patient can not list any medications or indications of medications.   For patients whose medication knowledge <4, medication information was verified with bottles, caregiver, pharmacy, physician, insurance, or another reliable source.    Information Sources  X  Patient Interview     Electronic PTA Medication List     Medication List     Medication Vials or Bottles     Family or Caregiver Interview     Pharmacy:     Other, please note:       Allergies and Intolerances  Review of patient's allergies indicates no known allergies.      Prior to Administration Medications    (Not in a hospital admission)      Current Hospital Administered Medications              Gearldine Shown [phone]  Student Pharmacist  [facility]

## 2014-11-23 DIAGNOSIS — G510 Bell's palsy: Secondary | ICD-10-CM | POA: Diagnosis not present

## 2014-11-23 LAB — COMPREHENSIVE METABOLIC PANEL
ALT: 84 U/L — ABNORMAL HIGH (ref 0–55)
AST (SGOT): 62 U/L — ABNORMAL HIGH (ref 5–34)
Albumin/Globulin Ratio: 1.3 (ref 0.9–2.2)
Albumin: 3.4 g/dL — ABNORMAL LOW (ref 3.5–5.0)
Alkaline Phosphatase: 100 U/L (ref 37–106)
Anion Gap: 10 (ref 5.0–15.0)
BUN: 6.5 mg/dL — ABNORMAL LOW (ref 7.0–19.0)
Bilirubin, Total: 0.3 mg/dL (ref 0.2–1.2)
CO2: 24 mEq/L (ref 22–29)
Calcium: 8.7 mg/dL (ref 8.5–10.5)
Chloride: 106 mEq/L (ref 100–111)
Creatinine: 0.8 mg/dL (ref 0.6–1.0)
Globulin: 2.6 g/dL (ref 2.0–3.6)
Glucose: 113 mg/dL — ABNORMAL HIGH (ref 70–100)
Potassium: 3.9 mEq/L (ref 3.5–5.1)
Protein, Total: 6 g/dL (ref 6.0–8.3)
Sodium: 140 mEq/L (ref 136–145)

## 2014-11-23 LAB — ECG 12-LEAD
Atrial Rate: 97 {beats}/min
P Axis: 66 degrees
P-R Interval: 142 ms
Q-T Interval: 338 ms
QRS Duration: 74 ms
QTC Calculation (Bezet): 429 ms
R Axis: 24 degrees
T Axis: -8 degrees
Ventricular Rate: 97 {beats}/min

## 2014-11-23 LAB — HEMOGLOBIN A1C: Hemoglobin A1C: 5.4 % (ref 0.0–6.0)

## 2014-11-23 LAB — GLUCOSE CSF: CSF Glucose: 66 mg/dL (ref 40–70)

## 2014-11-23 LAB — PROTEIN, CSF: CSF Protein: 26.1 mg/dL (ref 15.0–40.0)

## 2014-11-23 LAB — CELL COUNT CSF TUBE #3
CSF Neutrophils Tube #3: 0 % (ref 0–6)
CSF RBC Count Tube #3: 0 /mm3 (ref 0–3)
CSF WBC Count Tube #3: 0 /mm3 (ref 0–5)

## 2014-11-23 LAB — GFR: EGFR: >60

## 2014-11-23 MED ORDER — METHYLPREDNISOLONE SODIUM SUCC 125 MG IJ SOLR
125.0000 mg | Freq: Once | INTRAMUSCULAR | Status: AC
Start: 2014-11-23 — End: 2014-11-23
  Administered 2014-11-23: 125 mg via INTRAVENOUS
  Filled 2014-11-23: qty 2

## 2014-11-23 MED ORDER — METHYLPREDNISOLONE SODIUM SUCC 40 MG IJ SOLR
40.0000 mg | Freq: Two times a day (BID) | INTRAMUSCULAR | Status: DC
Start: 2014-11-23 — End: 2014-11-24
  Administered 2014-11-23: 40 mg via INTRAVENOUS
  Filled 2014-11-23: qty 1

## 2014-11-23 MED ORDER — SODIUM CHLORIDE 0.9 % IV MBP
2.0000 g | INTRAVENOUS | Status: DC
Start: 2014-11-23 — End: 2014-11-24
  Administered 2014-11-23: 2 g via INTRAVENOUS
  Filled 2014-11-23: qty 2000

## 2014-11-23 MED ORDER — SODIUM CHLORIDE 0.9 % IV SOLN
10.0000 mg/kg | Freq: Three times a day (TID) | INTRAVENOUS | Status: DC
Start: 2014-11-23 — End: 2014-11-24
  Administered 2014-11-23 (×2): 750 mg via INTRAVENOUS
  Filled 2014-11-23 (×3): qty 15

## 2014-11-23 MED ORDER — PANTOPRAZOLE SODIUM 40 MG PO TBEC
40.0000 mg | DELAYED_RELEASE_TABLET | Freq: Every day | ORAL | Status: AC
Start: 2014-11-23 — End: 2014-12-03

## 2014-11-23 MED ORDER — SODIUM CHLORIDE 0.9 % IV SOLN
INTRAVENOUS | Status: DC
Start: 2014-11-23 — End: 2014-11-24

## 2014-11-23 MED ORDER — DOXYCYCLINE HYCLATE 100 MG PO TABS
100.0000 mg | ORAL_TABLET | Freq: Two times a day (BID) | ORAL | Status: AC
Start: 2014-11-23 — End: 2014-12-14

## 2014-11-23 MED ORDER — METHYLPREDNISOLONE SODIUM SUCC 125 MG IJ SOLR
60.0000 mg | Freq: Three times a day (TID) | INTRAMUSCULAR | Status: DC
Start: 2014-11-23 — End: 2014-11-23

## 2014-11-23 MED ORDER — SODIUM CHLORIDE 0.9 % IV SOLN
10.0000 mg/kg | Freq: Three times a day (TID) | INTRAVENOUS | Status: DC
Start: 2014-11-23 — End: 2014-11-23
  Filled 2014-11-23: qty 15

## 2014-11-23 MED ORDER — SUMATRIPTAN SUCCINATE 50 MG PO TABS
50.0000 mg | ORAL_TABLET | Freq: Once | ORAL | Status: AC
Start: 2014-11-23 — End: 2014-11-23
  Administered 2014-11-23: 50 mg via ORAL
  Filled 2014-11-23: qty 1

## 2014-11-23 MED ORDER — LIDOCAINE HCL (PF) 1 % IJ SOLN
10.0000 mg | Freq: Once | INTRAMUSCULAR | Status: AC
Start: 2014-11-23 — End: 2014-11-23
  Administered 2014-11-23: 10 mg via INTRAVENOUS
  Filled 2014-11-23: qty 5

## 2014-11-23 MED ORDER — SUMATRIPTAN SUCCINATE 25 MG PO TABS
25.0000 mg | ORAL_TABLET | Freq: Four times a day (QID) | ORAL | Status: AC | PRN
Start: 2014-11-23 — End: ?

## 2014-11-23 MED ORDER — PREDNISONE 10 MG PO TABS
ORAL_TABLET | ORAL | Status: AC
Start: 2014-11-23 — End: ?

## 2014-11-23 MED ORDER — ACYCLOVIR 800 MG PO TABS
800.0000 mg | ORAL_TABLET | Freq: Two times a day (BID) | ORAL | Status: AC
Start: 2014-11-23 — End: 2014-11-28

## 2014-11-23 MED ORDER — ONDANSETRON HCL 4 MG PO TABS
4.0000 mg | ORAL_TABLET | Freq: Three times a day (TID) | ORAL | Status: AC | PRN
Start: 2014-11-23 — End: ?

## 2014-11-23 NOTE — Progress Note - Problem Oriented Charting Notewrit (Signed)
Progress Note neurology    Nicole Holder UJW:[id number]:[id number] is a 49 y.o. female,   No chief complaint on file.    11/23/2014   Principal Problem:    Stroke-like symptoms  Active Problems:    Anisocoria    New onset headache    Depression    Cluster headache    Bell's palsy    Past Medical History   Diagnosis Date   . Disorder of liver    . Depression          Subjective:   [name] today notice left facial weakness,  Less headache, No chest pain, No abdominal pain - No Nausea, No speech change.No visual change. No swallowing change. No Cough - SOB. No any seizure activity    Objective:   Blood pressure 123/62, pulse 77, temperature 98.5 F (36.9 C), temperature source Temporal Artery, resp. rate 18, height 1.702 m (5\' 7" ), weight 72.576 kg (160 lb), SpO2 96 %.  No intake or output data in the 24 hours ending 11/23/14 1045  Scheduled Meds:  Current Facility-Administered Medications   Medication Dose Route Frequency   . acyclovir  10 mg/kg Intravenous Q8H   . aspirin  81 mg Oral Daily   . lidocaine  10 mg Intravenous Once   . methylPREDNISolone  40 mg Intravenous BID   . venlafaxine  75 mg Oral Daily     Continuous Infusions:  . sodium chloride       PRN Meds:.acetaminophen **OR** acetaminophen, gadobutrol, ondansetron, ondansetron **OR** ondansetron, zolpidem  Exam  Awake Alert, Oriented , left facial facial weakness  Speech intact.  Pupil right 3-4, left 4-5 mm both react, Extra occular movements intact, No nystagmus, Left facial weakness  Move extremities upper and lower, fair grip.  Deep tendon reflex intact. Sensory intact.  Finger to nose intact   Supple Neck,No JVD, No carotid bruit appreciated.   Symmetrical Chest Locastro movement, Good air movement bilaterally,   Normal 1st, and 2nd heart sound.  +ve B.Sounds, Abdomen Soft, Non tender, No organomegaly appriciated, No rebound -guarding or rigidity.  Extremities:No Cyanosis, Clubbing or edema, No new Rash or bruise  Data Review   CXR personal review -    EKG personal review  Echo  CT  CBC w Diff:   Lab Results   Component Value Date    WBC 4.33 11/22/2014    HGB 13.0 11/22/2014    HCT 38.9 11/22/2014    PLT 158 11/22/2014     CMP:   Lab Results   Component Value Date    NA 140 11/23/2014    K 3.9 11/23/2014    CL 106 11/23/2014    CO2 24 11/23/2014    BUN 6.5* 11/23/2014    GLU 113* 11/23/2014    PROT 6.0 11/23/2014    CA 8.7 11/23/2014    ALKPHOS 100 11/23/2014    AST 62* 11/23/2014    ALT 84* 11/23/2014      Cardiac markers: No results found for: CKMB, MYOGLOBIN   BNP: No results found for: BNP         No results found for: LIPASE   Radiology Reports  Radiology imaging reviewed  Ct Angiogram Head    11/22/2014   History: Headaches, right pupil dilatation.  FINDINGS: Following administration of intravenous contrast contiguous axial images were obtained through the head according to a CT angiogram protocol. Then delayed contrast enhanced images of the brain were obtained. Total of 100 cc of Omnipaque 350 was utilized.  Multiplanar and maximum intensity projection images of the CT angiogram of the head were performed. 3-D rendering was performed on an independent workstation. Correlation with a brain CT performed earlier the same day.  The contrast-enhanced brain CT demonstrates no abnormal enhancement. The previously seen small oval density in the right posterior pons appears somewhat linear on the current examination. It is nonspecific, could be artifactual.  The intracranial internal carotid arteries are normal appearing. The basilar artery is normal in caliber. The right A1 segment appears congenitally smaller. The anterior communicating artery appears minimally bulbous. There is a tiny outpouching extending superiorly from its right aspect measuring approximately 1 mm suspicious for a very small aneurysm or an infundibulum at the takeoff of a vessel which is below CT angiographic resolution. The proximal visualized portions of the anterior, middle and posterior  cerebral arteries are otherwise unremarkable. The posterior communicating arteries are small as the posterior cerebral arteries are supplied mostly from the basilar artery.     11/22/2014   Impression: 1. The previously questioned abnormality in the right posterior pons does not demonstrate enhancement. It is nonspecific. Please see the discussion above. 2. There is a 1 mm outpouching extending superiorly from the anterior communicating artery complex which could represent a tiny aneurysm or an infundibulum at the origin of the vessel which is below CT angiographic resolution. No posterior communicating artery aneurysm is seen.  Terrilee Croak, MD  11/22/2014 11:01 AM     Ct Head Wo Contrast    11/22/2014   History: Headache, dilated right pupil.  Findings: Brain CT without intravenous contrast. No comparison studies.  There is a small oval hypodensity in the posterior portion of the right pons. It is nonspecific, could represent a small chronic infarction. There is no mass effect, acute intracranial hemorrhage. The ventricular system and cisterns are normally configured. The visualized paranasal sinuses and calvarium are unremarkable.     11/22/2014   Impression: There is a small hypodensity in the posterior pons on the right side. It is nonspecific, could represent a small chronic infarction. If clinically indicated MRI could be performed for better evaluation.  Terrilee Croak, MD  11/22/2014 9:21 AM     Mri Angiogram Neck With Contrast    11/22/2014   History: Headache, dilated pupil  TECHNIQUE: Extracranial     MR angiogram with noncontrast TOF MRA, and    Gadavist contrast-enhanced dynamic MR angiogram. Any proximal internal carotid artery stenosis are made referencing the distal internal carotid artery. COMPARISON: None. FINDINGS:    Right carotid:    The brachiocephalic, the right common carotid, the right internal carotid arteries are without hemodynamically significant stenosis.  Left carotid:    The left  common and the left internal carotid arteries are without hemodynamically significant stenosis.   Vertebral:    The vertebral arteries are without hemodynamically significant stenosis.        11/22/2014     Negative neck MR angiogram.  Einar Pheasant, MD  11/22/2014 6:40 PM     Mri Brain W Wo Contrast    11/22/2014   History: Headache, dilated pupil  TECHNIQUE: Routine MRI brain without  contrast.  COMPARISON: CT scan dated 11/22/2014. FINDINGS: At the site of previously noted abnormality of the right posterior pons, there is normal appearance without focal abnormal signal intensity on current examination. The brain  is unremarkable for age.  The diffusion weighted images are normal without acute ischemia. There is no mass hemorrhage or extra-axial fluid  collection.  The ventricles and the basal cisterns are normal without mass effect. The intracranial vessels are grossly normal.  The paranasal sinuses are normal.          11/22/2014     Negative MRI of the brain.    Einar Pheasant, MD  11/22/2014 6:41 PM     US Abdomen Complete    11/22/2014   HISTORY: Elevated LFTs  FINDINGS:  Liver: Minimally increased echogenicity within the hepatic parenchyma. No definite focal fatty sparing to confirm fatty infiltration. A 1.5 x 1.9 x 1.3 cm hepatic cyst is present. Pancreas: The visualized pancreas is unremarkable. Gallbladder: Trace dependent sludge is within the gallbladder. Bile ducts: Normal. Common duct measures 5 mm. Spleen: Normal. Aorta: Normal caliber.  IVC: Visualized IVC is patent.  Free fluid: None. Kidneys: Normal size and echotexture with no obstruction. Right kidney measures 10.6 cm in length. Left kidney measures 10.6 cm in length. Prominent dromedary hump within the left kidney.     11/22/2014    1. Trace dependent sludge within the gallbladder. No sonographic evidence of acute cholecystitis. 2. Suspected mild fatty infiltration.  Launa Flight, MD  11/22/2014 10:51 PM     Chest Ap Portable    11/22/2014   Chest x-ray  HISTORY:  Stroke evaluation  Comparison: None  Frontal view of the chest obtained. No focal consolidation within the lungs. Pulmonary vasculature within normal limits. No definite pleural effusion or pneumothorax. Cardia mediastinal contour unremarkable.     11/22/2014    No acute finding.  Sallye Lat, MD  11/22/2014 10:08 AM     Significant Tests:  See full reports for all details   @[email or web address]   Assessment & Plan    Likely Bells Palsy with ongoing anasacoria and headache.  CT scan of the head showed a small hypodensity in the posterior pons on the right side. Nonspecific and could represent a small chronic infarction. For this reason, a CTA of the head was performed. The CT angiogram shows that the previously. Question abnormality in the right posterior pons does not demonstrate enhancement, nonspecific and could be artifactual. However, there is a 1 millimeter outpouching suspicious for small aneurysm or an infundibulum at the takeoff of a vessel which is below CT angiographic resolution. The proximal visualized portion of the anterior, middle and posterior cerebral arteries are otherwise unremarkable.   MRI of the head with and without contrast along with MRA of the neck were negative  Stroke protocol   Neurology consultation, Dr. Marney Doctor. I spoke with him this morning given new symptoms of right sided facial droop and inability to fully close right eye. 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 Will start Acyclovir and Solumedrol now. Lyme IGG/IGM (Western Blot) ordered.   Telemetry monitoring   Sedimentation rate is negative  Transaminitis, acute.  Did lumbar puncture for meningitis, lyme, CSF sent to lab.

## 2014-11-23 NOTE — OT Progress Note (Signed)
Occupational Therapy Cancellation Note    Patient: Nicole Holder  [id number]    Unit: O130/Q657.A    Patient not seen for occupational therapy evaluation secondary to pt currently on bedrest for approx 4 hrs s/p lumbar puncture. Will follow as appropriate.    Genia Del, MS, OTR/L  Ext: [phone]  Pager #: [phone]

## 2014-11-23 NOTE — Plan of Care (Signed)
Problem: Health Promotion  Goal: Vaccination Screening  All patients will be screened for current vaccination status on each admission.   Outcome: Completed Date Met:  11/23/14  Not needed at this time.  Goal: Knowledge - disease process  Extent of understanding conveyed about a specific disease process.   Outcome: Progressing  IV solu-medrol, IV Zovirax, labs, aspiration precautions, diet changed to mechanical soft, supportive care.  Goal: Risk control - tobacco abuse  Actions to eliminate or reduce tobacco use.   Outcome: Completed Date Met:  11/23/14  Never a smoker    Problem: Safety  Goal: Patient will be free from injury during hospitalization  Outcome: Progressing    Problem: Pain  Goal: Patient's pain/discomfort is manageable  Outcome: Progressing    Problem: Psychosocial and Spiritual Needs  Goal: Demonstrates ability to cope with hospitalization/illness  Outcome: Progressing    Problem: Day of Admission- Stroke  Goal: Neurological status is stable or improving  Outcome: Progressing  Goal: Stable vital signs and fluid balance  Outcome: Progressing  Goal: Nutritional Status Improving  Aspiration precautions

## 2014-11-23 NOTE — Plan of Care (Signed)
Problem: Health Promotion  Goal: Knowledge - disease process  Extent of understanding conveyed about a specific disease process.   Outcome: Progressing  Pt is getting rocephin and will be discharged when infusion is completed

## 2014-11-23 NOTE — Procedures (Signed)
L 4/5 approached after betadine prep and a consent, CSF obtained, no complication.

## 2014-11-23 NOTE — Progress Notes (Signed)
Lumbar tap performed by Dr. Marney Doctor at bedside. Consent was signed prior to. Patient tolerated well. Post LP activity explained. Stay flat on bed at least 4hours to avoid spinal headache. Patient verbalized understanding. Will continue to monitor.

## 2014-11-23 NOTE — PT Eval Note (Signed)
Physical Therapy Cancellation Note    Patient: Nicole Holder  [id number]    Unit: X324/M010.A    Patient not seen for physical therapy secondary to being on bed rest s/p lumbar puncture.  Will follow as approprate.  Hassie Bruce, PT,CLT

## 2014-11-23 NOTE — Discharge Summary (Addendum)
[facility] HOSPITALIST   Trigg Summary     Patient Info:   Date Time: 11/23/2014  6:07 PM   Patient Name:Nicole Holder   ZOX:[id number]    PCP: Moishe Spice, MD   Admit Date:11/22/2014   Attending Physician:Judene Logue, Drucilla Schmidt, MD      Hospital Course:   Brief history ad admission and Hospital course:  Patient is 49 year old female came in with anisocoria.  Right pupil dilated 5 mm left pupil is 2 mm, both reactive to light.  No change in vision.  CT scan of the head, CT of the head, MRI brain, MRA neck, all within normal limit.  Later on, patient started having facial droop on the left side diagnosed with Bell's palsy.  Patient started on acyclovir and IV Solu-Medrol.  I will discharge her on by mouth acyclovir and recommended dose of prednisone 60 mg for 5 days and then decreasing 10 mg every day after that for 5 more days.  Lumbar puncture was performed by neurologist.  Preliminary report within normal limit.  Got a call from neurologist.  Patient is stable for discharge.  Follow-up with neurologist within a week.  She did had marginally elevated LFTs.  Ultrasound abdomen was done, no acute process.  LFT improved after IV hydration.  She does drinks alcohol, most probably related to that.  Discharge diagnosis:  Please see H&P for complete details of HPI and ROS. The patient was admitted to [facility] and has been diagnosed with the following conditions and has been taken care as mentioned below.    -No stroke.  No transient ischemic attack diagnosed with Bell's palsy.   -One episode of fever, most probably from viral syndrome, which did give her Bell's palsy.  -Depression, controlled.  Continue home medication  --Diagnosed with migraine   Hospital Problems:  Principal Problem:    Stroke-like symptoms  Active Problems:    Anisocoria    New onset headache    Depression    Cluster headache    Bell's palsy     Admission Date:11/22/2014   Discharge Date:11/23/2014    Disposition: home   Condition at  Discharge and Prognosis: stable   Type of Admission: Observation   Medical Necessity for stay:as above   Code Status: Full Code       Clinical Presentation:      Chief Complaint: No chief complaint on file.     Vitals: Vitals reviewed height is 1.702 m (5\' 7" ) and weight is 72.576 kg (160 lb). Her temporal artery temperature is 97.5 F (36.4 C). Her blood pressure is 125/71 and her pulse is 76. Her respiration is 18 and oxygen saturation is 97%. Body mass index is 25.05 kg/(m^2).  Filed Vitals:    11/23/14 0600 11/23/14 1000 11/23/14 1400 11/23/14 1749   BP: 135/73 123/62 116/62 125/71   Pulse: 77  80 76   Temp: 97.8 F (36.6 C) 98.5 F (36.9 C) 97.3 F (36.3 C) 97.5 F (36.4 C)   TempSrc: Temporal Artery Temporal Artery Temporal Artery Temporal Artery   Resp: 16 18 16 18    Height:       Weight:       SpO2: 97% 96% 96% 97%     Intake and Output Summary (Last 24 hours) at Date Time No intake or output data in the 24 hours ending 11/23/14 1807   Physical Exam:  Physical Exam   Cardiovascular: Exam reveals no gallop and no friction rub.    No murmur  heard.  Pulmonary/Chest: No respiratory distress. She has no wheezes. She has no rales. She exhibits no tenderness.   Abdominal: She exhibits no distension and no mass. There is no tenderness. There is no rebound and no guarding. No hernia.   Neurological:   Left facial droop              Discharge Diagnosis and Instructions:   Pending Labs:  Unresulted Labs     Procedure . . . Date/Time    Varicella Zoster Antibody, IgG [[id number]] Collected:  11/23/14 0549    Specimen Information:  Blood Updated:  11/23/14 1523    Oligoclonal Bands CSF [[id number]] Collected:  11/23/14 0828    Specimen Information:  CSF (Lumbar Puncture Spinal Fluid) Updated:  11/23/14 1123    IgG, CSF [[id number]] Collected:  11/23/14 0828    Specimen Information:  CSF (Lumbar Puncture Spinal Fluid) Updated:  11/23/14 1123    Narrative:      No hemolysis    Cryptococcal Antigen Latex [[id number]]  Collected:  11/23/14 0828     Updated:  11/23/14 1123    Narrative:      On CSF, spinal fluid  2 ml serum from SST tube is also acceptable. Centrifuge within one hour of coll  ection.    Lyme disease, IgM, IgG, Western Blot [[id number]] Collected:  11/23/14 0549    Specimen Information:  Blood Updated:  11/23/14 0924    Varicella Zoster (VZV) antibody, IgM [[id number]] Collected:  11/23/14 0549    Specimen Information:  Blood Updated:  11/23/14 0924         Consultants:Plan IP CONSULT TO NEUROLOGY   Discharge Medications:     Discharge Medication List      Taking          acyclovir 800 MG tablet   Dose:  800 mg   Commonly known as:  ZOVIRAX   Take 1 tablet (800 mg total) by mouth 2 (two) times daily.       calcium carbonate 500 MG chewable tablet   Dose:  2 tablet   Commonly known as:  TUMS   Chew 2 tablets by mouth daily.       Magnesium 500 MG Tabs   Dose:  1 tablet   Take 1 tablet by mouth daily.       ondansetron 4 MG tablet   Dose:  4 mg   Commonly known as:  ZOFRAN   Take 1 tablet (4 mg total) by mouth every 8 (eight) hours as needed for Nausea.       pantoprazole 40 MG tablet   Dose:  40 mg   Commonly known as:  PROTONIX   Take 1 tablet (40 mg total) by mouth daily.       predniSONE 10 MG tablet   Commonly known as:  DELTASONE   Take 6 tablets for 5 days then 5 tabs for 1 day then 4 tabs for 1 day then 3 tabs for 1 day then 2 tabs for 1 day then 1 tab for 1 day.       venlafaxine 75 MG 24 hr capsule   Dose:  75 mg   Commonly known as:  EFFEXOR-XR   Take 75 mg by mouth daily.       VITAMIN D3 PO   Dose:  6000 mg   Take 6,000 mg by mouth daily.       zolpidem 5 MG tablet   Dose:  5 mg   Commonly known  as:  AMBIEN   Take 5 mg by mouth nightly as needed for Sleep.         STOP taking these medications          naproxen sodium 550 MG tablet   Commonly known as:  ANAPROX       PROBIOTIC DAILY PO            Labs/Images to be followed at your PCP office:follow with neurologist   Hospital Problems:Principal Problem:     Stroke-like symptoms  Active Problems:    Anisocoria    New onset headache    Depression    Cluster headache    Bell's palsy     Lists the present on admission hospital problems:Present on Admission:   . Stroke-like symptoms  . Anisocoria  . New onset headache  . Depression  . Cluster headache   Follow up:         Follow-up Information     Follow up with Moishe Spice, MD.    Specialties:  Family Medicine, Residents    Contact information:    [phone]  [place] [address]  [phone]          Follow up with Moishe Spice, MD. Schedule an appointment as soon as possible for a visit in 3 days.    Specialties:  Family Medicine, Residents    Contact information:    [id number]  [place] [address]  [phone]          Follow up with Rana, Brantley Stage, MD. Schedule an appointment as soon as possible for a visit in 2 weeks.    Specialty:  Neurology    Contact information:    [address]  [place] [address]  [phone]                Results of Labs/imaging:   Labs have been reviewed:   Coagulation Profile:   Recent Labs  Lab 11/22/14  1520   PT 13.3   PT INR 1.0   PTT 29        CBC review:   Recent Labs  Lab 11/22/14  1520 11/22/14  0803   WBC 4.33 4.14   HGB 13.0 13.8   HEMATOCRIT 38.9 41.2   PLATELETS 158 161   MCV 92.4 92.6   RDW 13 13   NEUTROPHILS  --  82   LYMPHOCYTES AUTOMATED  --  10   EOSINOPHILS AUTOMATED  --  0   IMMATURE GRANULOCYTE  --  0   NEUTROPHILS ABSOLUTE  --  3.38   ABSOLUTE IMMATURE GRANULOCYTE  --  0.00      Chem Review:  Recent Labs  Lab 11/23/14  0551 11/22/14  0937   SODIUM 140 139   POTASSIUM 3.9 3.9   CHLORIDE 106 104   CO2 24 25   BUN 6.5* 8.3   CREATININE 0.8 0.9   GLUCOSE 113* 120*   CALCIUM 8.7 9.4   BILIRUBIN, TOTAL 0.3 0.3   AST (SGOT) 62* 121*   ALT 84* 120*   ALKALINE PHOSPHATASE 100 116*      Results     Procedure Component Value Units Date/Time    Varicella Zoster Antibody, IgG [[id number]] Collected:  11/23/14 0549     Specimen Information:  Blood Updated:  11/23/14 1523    CELL COUNT CSF TUBE #3 [[id number]] Collected:  11/23/14 0828    Specimen Information:  CSF (  Lumbar Puncture Spinal Fluid) Updated:  11/23/14 1201     WBC Count CSF Tube #3 0 /cumm      RBC Count CSF Tube #3 0 /cumm      Neutrophils CSF Tube #3 0 %     PROTEIN, CSF [[id number]] Collected:  11/23/14 0828    Specimen Information:  Cerebrospinal Fluid from CSF (Lumbar Puncture Spinal Fluid) Updated:  11/23/14 1201     CSF Protein 26.1 mg/dL      CSF Spun Appearance Colorless     GLUCOSE, CSF [[id number]] Collected:  11/23/14 0828    Specimen Information:  Cerebrospinal Fluid from CSF (Lumbar Puncture Spinal Fluid) Updated:  11/23/14 1200     Glucose, CSF 66 mg/dL     Oligoclonal Bands CSF [[id number]] Collected:  11/23/14 0828    Specimen Information:  CSF (Lumbar Puncture Spinal Fluid) Updated:  11/23/14 1123    IgG, CSF [[id number]] Collected:  11/23/14 0828    Specimen Information:  CSF (Lumbar Puncture Spinal Fluid) Updated:  11/23/14 1123    Narrative:      No hemolysis    Cryptococcal Antigen Latex [[id number]] Collected:  11/23/14 2440     Updated:  11/23/14 1123    Narrative:      On CSF, spinal fluid  2 ml serum from SST tube is also acceptable. Centrifuge within one hour of coll  ection.    Lyme disease, IgMNicki Reaper Blot [[id number]] Collected:  11/23/14 0549    Specimen Information:  Blood Updated:  11/23/14 0924    Varicella Zoster (VZV) antibody, IgM [[id number]] Collected:  11/23/14 0549    Specimen Information:  Blood Updated:  11/23/14 0924    Comprehensive metabolic panel [[id number]]  (Abnormal) Collected:  11/23/14 0551    Specimen Information:  Blood Updated:  11/23/14 0758     Glucose 113 (H) mg/dL      BUN 6.5 (L) mg/dL      Creatinine 0.8 mg/dL      Sodium 332 mEq/L      Potassium 3.9 mEq/L      Chloride 106 mEq/L      CO2 24 mEq/L      Calcium 8.7 mg/dL      Protein, Total 6.0 g/dL      Albumin 3.4 (L) g/dL      AST (SGOT) 62 (H) U/L      ALT  84 (H) U/L      Alkaline Phosphatase 100 U/L      Bilirubin, Total 0.3 mg/dL      Globulin 2.6 g/dL      Albumin/Globulin Ratio 1.3      Anion Gap 10.0     GFR [[id number]] Collected:  11/23/14 0551     EGFR >60.0 Updated:  11/23/14 0758    Hemoglobin A1c [[id number]] Collected:  11/22/14 1520    Specimen Information:  Blood Updated:  11/23/14 0150     Hemoglobin A1C 5.4 %     Troponin I [[id number]] Collected:  11/22/14 2142    Specimen Information:  Blood Updated:  11/22/14 2230     Troponin I 0.01 ng/mL     Narrative:      Notify MD for Troponin value of greater than 1.0    Lipid panel (Fasting) [[id number]] Collected:  11/22/14 1520    Specimen Information:  Blood Updated:  11/22/14 2012     Cholesterol 129 mg/dL      Triglycerides 51 mg/dL      HDL 65 mg/dL  LDL Calculated 54 mg/dL      VLDL Cholesterol Cal 10 mg/dL      CHOL/HDL Ratio 2.0     Narrative:      Notify MD for Troponin value of greater than 1.0    Hemolysis index [[id number]] Collected:  11/22/14 1520     Hemolysis Index 1 Updated:  11/22/14 2012    Narrative:      Notify MD for Troponin value of greater than 1.0         Radiology reports have been reviewed:  Radiology Results (24 Hour)     Procedure Component Value Units Date/Time    US Abdomen Complete [[id number]] Collected:  11/22/14 2246    Order Status:  Completed Updated:  11/22/14 2255    Narrative:      HISTORY: Elevated LFTs    FINDINGS:   Liver: Minimally increased echogenicity within the hepatic parenchyma.  No definite focal fatty sparing to confirm fatty infiltration. A 1.5 x  1.9 x 1.3 cm hepatic cyst is present.  Pancreas: The visualized pancreas is unremarkable.  Gallbladder: Trace dependent sludge is within the gallbladder.  Bile ducts: Normal. Common duct measures 5 mm.  Spleen: Normal.  Aorta: Normal caliber.   IVC: Visualized IVC is patent.   Free fluid: None.  Kidneys: Normal size and echotexture with no obstruction. Right kidney  measures 10.6 cm in length. Left kidney measures  10.6 cm in length.  Prominent dromedary hump within the left kidney.      Impression:        1. Trace dependent sludge within the gallbladder. No sonographic  evidence of acute cholecystitis.  2. Suspected mild fatty infiltration.    Launa Flight, MD   11/22/2014 10:51 PM      MRI Brain W WO Contrast [[id number]] Collected:  11/22/14 1804    Order Status:  Completed Updated:  11/22/14 1845    Narrative:      History: Headache, dilated pupil   TECHNIQUE: Routine MRI brain without  contrast.   COMPARISON: CT scan dated 11/22/2014.  FINDINGS:  At the site of previously noted abnormality of the right posterior pons,  there is normal appearance without focal abnormal signal intensity on  current examination.  The brain  is unremarkable for age.   The diffusion weighted images are normal without acute ischemia.  There is no mass hemorrhage or extra-axial fluid collection.   The ventricles and the basal cisterns are normal without mass effect.  The intracranial vessels are grossly normal.   The paranasal sinuses are normal.            Impression:         Negative MRI of the brain.       Einar Pheasant, MD   11/22/2014 6:41 PM      MRI angiogram neck with contrast [[id number]] Collected:  11/22/14 1807    Order Status:  Completed Updated:  11/22/14 1844    Narrative:      History: Headache, dilated pupil    TECHNIQUE: Extracranial     MR angiogram with noncontrast TOF MRA, and     Gadavist contrast-enhanced dynamic MR angiogram. Any proximal internal  carotid artery stenosis are made referencing the distal internal carotid  artery.  COMPARISON: None.  FINDINGS:     Right carotid:    The brachiocephalic, the right common carotid, the  right internal carotid arteries are without hemodynamically significant  stenosis.   Left carotid:    The  left common and the left internal carotid arteries  are without hemodynamically significant stenosis.    Vertebral:    The vertebral arteries are without hemodynamically  significant stenosis.          Impression:         Negative neck MR angiogram.    Einar Pheasant, MD   11/22/2014 6:40 PM          Ct Angiogram Head    11/22/2014   History: Headaches, right pupil dilatation.  FINDINGS: Following administration of intravenous contrast contiguous axial images were obtained through the head according to a CT angiogram protocol. Then delayed contrast enhanced images of the brain were obtained. Total of 100 cc of Omnipaque 350 was utilized. Multiplanar and maximum intensity projection images of the CT angiogram of the head were performed. 3-D rendering was performed on an independent workstation. Correlation with a brain CT performed earlier the same day.  The contrast-enhanced brain CT demonstrates no abnormal enhancement. The previously seen small oval density in the right posterior pons appears somewhat linear on the current examination. It is nonspecific, could be artifactual.  The intracranial internal carotid arteries are normal appearing. The basilar artery is normal in caliber. The right A1 segment appears congenitally smaller. The anterior communicating artery appears minimally bulbous. There is a tiny outpouching extending superiorly from its right aspect measuring approximately 1 mm suspicious for a very small aneurysm or an infundibulum at the takeoff of a vessel which is below CT angiographic resolution. The proximal visualized portions of the anterior, middle and posterior cerebral arteries are otherwise unremarkable. The posterior communicating arteries are small as the posterior cerebral arteries are supplied mostly from the basilar artery.     11/22/2014   Impression: 1. The previously questioned abnormality in the right posterior pons does not demonstrate enhancement. It is nonspecific. Please see the discussion above. 2. There is a 1 mm outpouching extending superiorly from the anterior communicating artery complex which could represent a tiny aneurysm or an infundibulum at the origin of the vessel  which is below CT angiographic resolution. No posterior communicating artery aneurysm is seen.  Terrilee Croak, MD  11/22/2014 11:01 AM     Ct Head Wo Contrast    11/22/2014   History: Headache, dilated right pupil.  Findings: Brain CT without intravenous contrast. No comparison studies.  There is a small oval hypodensity in the posterior portion of the right pons. It is nonspecific, could represent a small chronic infarction. There is no mass effect, acute intracranial hemorrhage. The ventricular system and cisterns are normally configured. The visualized paranasal sinuses and calvarium are unremarkable.     11/22/2014   Impression: There is a small hypodensity in the posterior pons on the right side. It is nonspecific, could represent a small chronic infarction. If clinically indicated MRI could be performed for better evaluation.  Terrilee Croak, MD  11/22/2014 9:21 AM     Mri Angiogram Neck With Contrast    11/22/2014   History: Headache, dilated pupil  TECHNIQUE: Extracranial     MR angiogram with noncontrast TOF MRA, and    Gadavist contrast-enhanced dynamic MR angiogram. Any proximal internal carotid artery stenosis are made referencing the distal internal carotid artery. COMPARISON: None. FINDINGS:    Right carotid:    The brachiocephalic, the right common carotid, the right internal carotid arteries are without hemodynamically significant stenosis.  Left carotid:    The left common and the left internal carotid arteries are  without hemodynamically significant stenosis.   Vertebral:    The vertebral arteries are without hemodynamically significant stenosis.        11/22/2014     Negative neck MR angiogram.  Einar Pheasant, MD  11/22/2014 6:40 PM     Mri Brain W Wo Contrast    11/22/2014   History: Headache, dilated pupil  TECHNIQUE: Routine MRI brain without  contrast.  COMPARISON: CT scan dated 11/22/2014. FINDINGS: At the site of previously noted abnormality of the right posterior pons, there is normal appearance  without focal abnormal signal intensity on current examination. The brain  is unremarkable for age.  The diffusion weighted images are normal without acute ischemia. There is no mass hemorrhage or extra-axial fluid collection.  The ventricles and the basal cisterns are normal without mass effect. The intracranial vessels are grossly normal.  The paranasal sinuses are normal.          11/22/2014     Negative MRI of the brain.    Einar Pheasant, MD  11/22/2014 6:41 PM     US Abdomen Complete    11/22/2014   HISTORY: Elevated LFTs  FINDINGS:  Liver: Minimally increased echogenicity within the hepatic parenchyma. No definite focal fatty sparing to confirm fatty infiltration. A 1.5 x 1.9 x 1.3 cm hepatic cyst is present. Pancreas: The visualized pancreas is unremarkable. Gallbladder: Trace dependent sludge is within the gallbladder. Bile ducts: Normal. Common duct measures 5 mm. Spleen: Normal. Aorta: Normal caliber.  IVC: Visualized IVC is patent.  Free fluid: None. Kidneys: Normal size and echotexture with no obstruction. Right kidney measures 10.6 cm in length. Left kidney measures 10.6 cm in length. Prominent dromedary hump within the left kidney.     11/22/2014    1. Trace dependent sludge within the gallbladder. No sonographic evidence of acute cholecystitis. 2. Suspected mild fatty infiltration.  Launa Flight, MD  11/22/2014 10:51 PM     Chest Ap Portable    11/22/2014   Chest x-ray  HISTORY: Stroke evaluation  Comparison: None  Frontal view of the chest obtained. No focal consolidation within the lungs. Pulmonary vasculature within normal limits. No definite pleural effusion or pneumothorax. Cardia mediastinal contour unremarkable.     11/22/2014    No acute finding.  Sallye Lat, MD  11/22/2014 10:08 AM      Pathology:   Specimens     None             Hospitalist:   Signed by: Zada Finders   11/23/2014 6:07 PM   Time spent for discharge: 30 minutes

## 2014-11-23 NOTE — Progress Notes (Signed)
[facility] H[facility]  Progress Note    Patient Info:   Date/Time: 11/23/2014 / 7:27 AM   Patient Name:Nicole Holder   ZDG:[id number]    PCP: Moishe Spice, MD   Admit Date:11/22/2014   Attending Physician:Afzal, Drucilla Schmidt, MD      Assessment and Plan:   1.  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  CT scan of the head showed a small hypodensity in the posterior pons on the right side. Nonspecific and could represent a small chronic infarction. For this reason, a CTA of the head was performed. The CT angiogram shows that the previously. Question abnormality in the right posterior pons does not demonstrate enhancement, nonspecific and could be artifactual. However, there is a 1 millimeter outpouching suspicious for small aneurysm or an infundibulum at the takeoff of a vessel which is below CT angiographic resolution. The proximal visualized portion of the anterior, middle and posterior cerebral arteries are otherwise unremarkable.   MRI of the head with and without contrast along with MRA of the neck were negative  Stroke protocol   Neurology consultation, Dr. Marney Doctor. I spoke with him this morning given new symptoms of right sided facial droop and inability to fully close right eye. 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 Will start Acyclovir and Solumedrol now. Lyme IGG/IGM (Western Blot) ordered.   Telemetry monitoring   Sedimentation rate is negative  Aspirin therapy.  Check lipids.  Hemoglobin A1c is 5.4    2. Transaminitis, acute .  Patient reports full physical in December and labs were normal.  Abdominal ultrasound show trace dependent sludge sludge within the GB. Likely fatty liver.    3. Depression, chronic and well controlled.  Continue Effexor .   Hospital Problems:  Principal Problem:    Stroke-like symptoms  Active Problems:    Anisocoria    New onset headache    Depression    Cluster headache    Bell's palsy      DVT Prohylaxis:SEDs and ambulating    Code Status: Full Code   Disposition:home   PT/OT/ST/RT/CM/Nutrition input:   Type of Admission:Observation   Estimated Length of Stay (including stay in the ER receiving treatment): <2 midnights   Medical Necessity for stay: stroke like symptoms likely Bells Palsy with symptoms of anisocoria and headache, maybe complex migraine.      Subjective:   Chief Complaint:No chief complaint on file.     Update on current symptoms: progressing to include right sided facial droop and inability to close right eye. Headache improved with Imitrex.    Review of Systems:    Items in BOLD are positive:      Constitutional: negative for anorexia, chills, fatigue, fevers, malaise, night sweats, sweats and weight loss  Eyes: negative for cataracts, color blindness, contacts/glasses, glaucoma, icterus, irritation, redness and visual disturbance, photophobia  Ears, nose, mouth, throat, and face: negative for ear drainage, earaches, epistaxis, facial trauma, hearing loss, hoarseness, nasal congestion, snoring, sore mouth, sore throat, tinnitus and voice change, dysphagia, no hearing devices in use.   Respiratory: negative for asthma, chronic bronchitis, cough, dyspnea on exertion, emphysema, hemoptysis, pleurisy/chest pain, pneumonia, sputum, stridor and wheezing, prior intubations  Cardiovascular: negative for chest pain, chest pressure/discomfort, claudication, dyspnea, exertional chest pressure/discomfort, fatigue, irregular heart beat, lower extremity edema, near-syncope, orthopnea, palpitations, paroxysmal nocturnal dyspnea, exercise intolerance syncope and tachypnea  Gastrointestinal: negative for abdominal pain, change in bowel habits, constipation, diarrhea, dyspepsia, dysphagia, jaundice,  melena, nausea, odynophagia, reflux symptoms, hematochezia and vomiting  Genitourinary: negative for decreased stream, dysuria, frequency, hematuria, hesitancy, nocturia and urinary  incontinence  Hematologic/lymphatic: negative for bleeding, easy bruising, lymphadenopathy and petechiae  Musculoskeletal: negative for arthralgias, back pain, bone pain, muscle weakness, myalgias, neck pain and stiff joints  Neurological: right sided facial droop with inability to close right eye. negative for coordination problems, dizziness, gait problems, headaches, memory problems, paresthesia, seizures, speech problems, tremors, vertigo and weakness  Behavioral/Psych: negative for abusive relationship, anxiety, depression, excessive alcohol consumption, fatigue, illegal drug usage, mood swings and tobacco use  Endocrine: negative for diabetic symptoms including blurry vision, increased fatigue, polydipsia, polyphagia, polyuria, poor wound healing, pruritus, skin dryness and weight loss and temperature intolerance  Allergic/Immunologic: negative for anaphylaxis, angioedema, hay fever and urticaria   Allergies:No Known Allergies   Medications:Meds have been reviewed   Current Facility-Administered Medications   Medication Dose Route Frequency Last Rate Last Dose   . 0.9%  NaCl infusion   Intravenous Continuous 150 mL/hr at 11/22/14 1431     . acetaminophen (TYLENOL) tablet 650 mg  650 mg Oral Q4H PRN        Or   . acetaminophen (TYLENOL) suppository 650 mg  650 mg Rectal Q4H PRN       . acyclovir (ZOVIRAX) 600 mg in sodium chloride 0.9 % 150 mL IVPB  10 mg/kg (Ideal) Intravenous Q8H       . aspirin chewable tablet 81 mg  81 mg Oral Daily       . gadobutrol (GADAVIST) injection SOLN 10 mmol  10 mL Intravenous ONCE PRN       . methylPREDNISolone sodium succinate (Solu-MEDROL) injection 125 mg  125 mg Intravenous Once       . methylPREDNISolone sodium succinate (Solu-MEDROL) injection 60 mg  60 mg Intravenous Q8H       . ondansetron (ZOFRAN) injection 4 mg  4 mg Intravenous Once PRN       . ondansetron (ZOFRAN-ODT) disintegrating tablet 4 mg  4 mg Oral Q8H PRN        Or   . ondansetron (ZOFRAN) injection 4 mg  4 mg  Intravenous Q8H PRN       . venlafaxine (EFFEXOR-XR) 24 hr capsule 75 mg  75 mg Oral Daily   75 mg at 11/22/14 2143   . zolpidem (AMBIEN) tablet 5 mg  5 mg Oral QHS PRN            Objective:   Vitals: Vitals reviewed height is 1.702 m (5\' 7" ) and weight is 72.576 kg (160 lb). Her temporal artery temperature is 97.8 F (36.6 C). Her blood pressure is 135/73 and her pulse is 77. Her respiration is 16 and oxygen saturation is 97%. Body mass index is 25.05 kg/(m^2).  Filed Vitals:    11/22/14 2200 11/23/14 0200 11/23/14 0300 11/23/14 0600   BP: 114/61 120/77  135/73   Pulse: 95 94  77   Temp: 99.2 F (37.3 C) 100.6 F (38.1 C) 98.2 F (36.8 C) 97.8 F (36.6 C)   TempSrc: Temporal Artery Temporal Artery Oral Temporal Artery   Resp: 16 16  16    Height:       Weight:       SpO2: 98% 97%  97%   Intake and Output Summary (Last 24 hours) at Date Time No intake or output data in the 24 hours ending 11/23/14 0727   Physical Exam:   Items in BOLD are positive findings:  General Appearance:    Alert, cooperative, no distress, appears stated age   Head:    Normocephalic, without obvious abnormality, atraumatic   Eyes:    PERRL, conjunctiva/corneas clear, EOM's intact,   Ears:    Normal  external ear canals bilaterally. No obvious hearing loss.   Nose:   Nares normal, septum midline, mucosa normal, no drainage    or sinus tenderness   Throat:   Lips, mucosa, and tongue normal; teeth and gums normal   Neck:   Supple, symmetrical, trachea midline, no adenopathy;     thyroid:  no enlargement/tenderness/nodules; no carotid    bruit or JVD   Back:     Symmetric, no curvature, ROM normal, no CVA tenderness   Lungs:     Clear to auscultation bilaterally, respirations unlabored   Chest Quaranta:    No tenderness or deformity    Heart:    Regular rate and rhythm, S1 and S2 normal, no murmur, rub   or gallop   Abdomen:     Soft, non-tender, bowel sounds active all four quadrants,     no masses, no organomegaly   Extremities:   Extremities  normal, atraumatic, no cyanosis or edema   Pulses:   2+ and symmetric all extremities   Skin:   Skin color, texture, turgor normal, no rashes or lesions   Lymph nodes:   Cervical, supraclavicular, and axillary nodes normal   Neurologic:   Right sided facial droop with inability to close right eye. Looks like Bells Palsy. CNII-XII intact, normal strength, sensation and reflexes     throughout              Results of Labs/imaging   Labs have been reviewed:   Coagulation Profile:   Recent Labs  Lab 11/22/14  1520   PT 13.3   PT INR 1.0   PTT 29        CBC review:   Recent Labs  Lab 11/22/14  1520 11/22/14  0803   WBC 4.33 4.14   HGB 13.0 13.8   HEMATOCRIT 38.9 41.2   PLATELETS 158 161   MCV 92.4 92.6   RDW 13 13   NEUTROPHILS  --  82   LYMPHOCYTES AUTOMATED  --  10   EOSINOPHILS AUTOMATED  --  0   IMMATURE GRANULOCYTE  --  0   NEUTROPHILS ABSOLUTE  --  3.38   ABSOLUTE IMMATURE GRANULOCYTE  --  0.00      Chem Review:  Recent Labs  Lab 11/22/14  0937   SODIUM 139   POTASSIUM 3.9   CHLORIDE 104   CO2 25   BUN 8.3   CREATININE 0.9   GLUCOSE 120*   CALCIUM 9.4   BILIRUBIN, TOTAL 0.3   AST (SGOT) 121*   ALT 120*   ALKALINE PHOSPHATASE 116*      Results     Procedure Component Value Units Date/Time    Comprehensive metabolic panel [[id number]] Collected:  11/23/14 0551    Specimen Information:  Blood Updated:  11/23/14 0725    Hemoglobin A1c [[id number]] Collected:  11/22/14 1520    Specimen Information:  Blood Updated:  11/23/14 0150     Hemoglobin A1C 5.4 %     Troponin I [[id number]] Collected:  11/22/14 2142    Specimen Information:  Blood Updated:  11/22/14 2230     Troponin I 0.01 ng/mL     Narrative:      Notify MD for Troponin value of greater  than 1.0    Lipid panel (Fasting) [[id number]] Collected:  11/22/14 1520    Specimen Information:  Blood Updated:  11/22/14 2012     Cholesterol 129 mg/dL      Triglycerides 51 mg/dL      HDL 65 mg/dL      LDL Calculated 54 mg/dL      VLDL Cholesterol Cal 10 mg/dL      CHOL/HDL  Ratio 2.0     Narrative:      Notify MD for Troponin value of greater than 1.0    Hemolysis index [[id number]] Collected:  11/22/14 1520     Hemolysis Index 1 Updated:  11/22/14 2012    Narrative:      Notify MD for Troponin value of greater than 1.0    Troponin I [[id number]] Collected:  11/22/14 1520    Specimen Information:  Blood Updated:  11/22/14 1601     Troponin I <0.01 ng/mL     Narrative:      Notify MD for Troponin value of greater than 1.0    Sedimentation rate (ESR) [[id number]] Collected:  11/22/14 1520    Specimen Information:  Blood Updated:  11/22/14 1554     Sed Rate 17 mm/Hr     Narrative:      Notify MD for Troponin value of greater than 1.0    Protime-INR [[id number]] Collected:  11/22/14 1520    Specimen Information:  Blood Updated:  11/22/14 1552     PT 13.3 sec      PT INR 1.0      PT Anticoag. Given Within 48 hrs. None     Narrative:      Notify MD for Troponin value of greater than 1.0    APTT [[id number]] Collected:  11/22/14 1520     PTT 29 sec Updated:  11/22/14 1551    Narrative:      Notify MD for Troponin value of greater than 1.0    CBC [[id number]] Collected:  11/22/14 1520    Specimen Information:  Blood from Blood Updated:  11/22/14 1538     WBC 4.33 x10 3/uL      Hgb 13.0 g/dL      Hematocrit 74.2 %      Platelets 158 x10 3/uL      RBC 4.21 x10 6/uL      MCV 92.4 fL      MCH 30.9 pg      MCHC 33.4 g/dL      RDW 13 %      MPV 11.5 fL     Narrative:      Notify MD for Troponin value of greater than 1.0    UA with reflex to micro (all hospital ED's and [facility]) [[id number]]  (Abnormal) Collected:  11/22/14 1047    Specimen Information:  Urine Updated:  11/22/14 1106     Urine Type Clean Catch      Color, UA Yellow      Clarity, UA Clear      Specific Gravity UA 1.010      Urine pH 7.0      Leukocyte Esterase, UA Negative      Nitrite, UA Negative      Protein, UR Negative      Glucose, UA Negative      Ketones UA Negative      Urobilinogen, UA Normal mg/dL      Bilirubin, UA  Negative      Blood, UA Moderate (A)  RBC, UA 6-10 (A) /hpf      WBC, UA 0-5 /hpf      Squamous Epithelial Cells, Urine 0-5 /hpf      Urine Mucus Present     Troponin I [[id number]] Collected:  11/22/14 0937    Specimen Information:  Blood Updated:  11/22/14 1022     Troponin I <0.01 ng/mL     Comprehensive metabolic panel [[id number]]  (Abnormal) Collected:  11/22/14 0937    Specimen Information:  Blood Updated:  11/22/14 1022     Glucose 120 (H) mg/dL      BUN 8.3 mg/dL      Creatinine 0.9 mg/dL      Sodium 272 mEq/L      Potassium 3.9 mEq/L      Chloride 104 mEq/L      CO2 25 mEq/L      Calcium 9.4 mg/dL      Protein, Total 6.8 g/dL      Albumin 4.1 g/dL      AST (SGOT) 536 (H) U/L      ALT 120 (H) U/L      Alkaline Phosphatase 116 (H) U/L      Bilirubin, Total 0.3 mg/dL      Globulin 2.7 g/dL      Albumin/Globulin Ratio 1.5      Anion Gap 10.0     GFR [[id number]] Collected:  11/22/14 0937     EGFR >60.0 Updated:  11/22/14 1022    Protime-INR [[id number]] Collected:  11/22/14 0937    Specimen Information:  Blood Updated:  11/22/14 1010     PT 12.7 sec      PT INR 0.9      PT Anticoag. Given Within 48 hrs. None     APTT [[id number]] Collected:  11/22/14 0937     PTT 25 sec Updated:  11/22/14 1010    Beta HCG, Qual, Serum [[id number]] Collected:  11/22/14 0937    Specimen Information:  Blood Updated:  11/22/14 1010     Hcg Qualitative Negative     CBC with differential [[id number]]  (Abnormal) Collected:  11/22/14 0803    Specimen Information:  Blood from Blood Updated:  11/22/14 0958     WBC 4.14 x10 3/uL      Hgb 13.8 g/dL      Hematocrit 57.3 %      Platelets 161 x10 3/uL      RBC 4.45 x10 6/uL      MCV 92.6 fL      MCH 31.0 pg      MCHC 33.5 g/dL      RDW 13 %      MPV 11.7 fL      Neutrophils 82 %      Lymphocytes Automated 10 %      Monocytes 8 %      Eosinophils Automated 0 %      Basophils Automated 0 %      Immature Granulocyte 0 %      Neutrophils Absolute 3.38 x10 3/uL      Abs Lymph Automated 0.43 (L) x10  3/uL      Abs Mono Automated 0.32 x10 3/uL      Abs Eos Automated 0.00 x10 3/uL      Absolute Baso Automated 0.01 x10 3/uL      Absolute Immature Granulocyte 0.00 x10 3/uL     Glucose Whole Blood - POCT [[id number]]  (Abnormal) Collected:  11/22/14 0931     POCT - Glucose  Whole blood 123 (H) mg/dL Updated:  86/57/84 6962         Radiology reports have been reviewed:  Radiology Results (24 Hour)     Procedure Component Value Units Date/Time    US Abdomen Complete [[id number]] Collected:  11/22/14 2246    Order Status:  Completed Updated:  11/22/14 2255    Narrative:      HISTORY: Elevated LFTs    FINDINGS:   Liver: Minimally increased echogenicity within the hepatic parenchyma.  No definite focal fatty sparing to confirm fatty infiltration. A 1.5 x  1.9 x 1.3 cm hepatic cyst is present.  Pancreas: The visualized pancreas is unremarkable.  Gallbladder: Trace dependent sludge is within the gallbladder.  Bile ducts: Normal. Common duct measures 5 mm.  Spleen: Normal.  Aorta: Normal caliber.   IVC: Visualized IVC is patent.   Free fluid: None.  Kidneys: Normal size and echotexture with no obstruction. Right kidney  measures 10.6 cm in length. Left kidney measures 10.6 cm in length.  Prominent dromedary hump within the left kidney.      Impression:        1. Trace dependent sludge within the gallbladder. No sonographic  evidence of acute cholecystitis.  2. Suspected mild fatty infiltration.    Launa Flight, MD   11/22/2014 10:51 PM      MRI Brain W WO Contrast [[id number]] Collected:  11/22/14 1804    Order Status:  Completed Updated:  11/22/14 1845    Narrative:      History: Headache, dilated pupil   TECHNIQUE: Routine MRI brain without  contrast.   COMPARISON: CT scan dated 11/22/2014.  FINDINGS:  At the site of previously noted abnormality of the right posterior pons,  there is normal appearance without focal abnormal signal intensity on  current examination.  The brain  is unremarkable for age.   The diffusion weighted  images are normal without acute ischemia.  There is no mass hemorrhage or extra-axial fluid collection.   The ventricles and the basal cisterns are normal without mass effect.  The intracranial vessels are grossly normal.   The paranasal sinuses are normal.            Impression:         Negative MRI of the brain.       Einar Pheasant, MD   11/22/2014 6:41 PM      MRI angiogram neck with contrast [[id number]] Collected:  11/22/14 1807    Order Status:  Completed Updated:  11/22/14 1844    Narrative:      History: Headache, dilated pupil    TECHNIQUE: Extracranial     MR angiogram with noncontrast TOF MRA, and     Gadavist contrast-enhanced dynamic MR angiogram. Any proximal internal  carotid artery stenosis are made referencing the distal internal carotid  artery.  COMPARISON: None.  FINDINGS:     Right carotid:    The brachiocephalic, the right common carotid, the  right internal carotid arteries are without hemodynamically significant  stenosis.   Left carotid:    The left common and the left internal carotid arteries  are without hemodynamically significant stenosis.    Vertebral:    The vertebral arteries are without hemodynamically  significant stenosis.         Impression:         Negative neck MR angiogram.    Einar Pheasant, MD   11/22/2014 6:40 PM      CT Angiogram  Head [[id number]] Collected:  11/22/14 1054    Order Status:  Completed Updated:  11/22/14 1105    Narrative:      History: Headaches, right pupil dilatation.    FINDINGS: Following administration of intravenous contrast contiguous  axial images were obtained through the head according to a CT angiogram  protocol. Then delayed contrast enhanced images of the brain were  obtained. Total of 100 cc of Omnipaque 350 was utilized. Multiplanar and  maximum intensity projection images of the CT angiogram of the head were  performed. 3-D rendering was performed on an independent workstation.  Correlation with a brain CT performed earlier the same day.    The  contrast-enhanced brain CT demonstrates no abnormal enhancement. The  previously seen small oval density in the right posterior pons appears  somewhat linear on the current examination. It is nonspecific, could be  artifactual.    The intracranial internal carotid arteries are normal appearing. The  basilar artery is normal in caliber. The right A1 segment appears  congenitally smaller. The anterior communicating artery appears  minimally bulbous. There is a tiny outpouching extending superiorly from  its right aspect measuring approximately 1 mm suspicious for a very  small aneurysm or an infundibulum at the takeoff of a vessel which is  below CT angiographic resolution. The proximal visualized portions of  the anterior, middle and posterior cerebral arteries are otherwise  unremarkable. The posterior communicating arteries are small as the  posterior cerebral arteries are supplied mostly from the basilar artery.      Impression:      Impression:  1. The previously questioned abnormality in the right posterior pons  does not demonstrate enhancement. It is nonspecific. Please see the  discussion above.  2. There is a 1 mm outpouching extending superiorly from the anterior  communicating artery complex which could represent a tiny aneurysm or an  infundibulum at the origin of the vessel which is below CT angiographic  resolution. No posterior communicating artery aneurysm is seen.    Terrilee Croak, MD   11/22/2014 11:01 AM      Chest AP Portable [[id number]] Collected:  11/22/14 1008    Order Status:  Completed Updated:  11/22/14 1012    Narrative:      Chest x-ray    HISTORY: Stroke evaluation    Comparison: None    Frontal view of the chest obtained. No focal consolidation within the  lungs. Pulmonary vasculature within normal limits. No definite pleural  effusion or pneumothorax. Cardia mediastinal contour unremarkable.      Impression:       No acute finding.    Sallye Lat, MD   11/22/2014 10:08 AM      CT Head  WO Contrast [[id number]] Collected:  11/22/14 0918    Order Status:  Completed Updated:  11/22/14 0925    Narrative:      History: Headache, dilated right pupil.    Findings: Brain CT without intravenous contrast. No comparison studies.    There is a small oval hypodensity in the posterior portion of the right  pons. It is nonspecific, could represent a small chronic infarction.  There is no mass effect, acute intracranial hemorrhage. The ventricular  system and cisterns are normally configured. The visualized paranasal  sinuses and calvarium are unremarkable.      Impression:      Impression: There is a small hypodensity in the posterior pons on the  right side. It is nonspecific, could represent a  small chronic  infarction. If clinically indicated MRI could be performed for better  evaluation.    Terrilee Croak, MD   11/22/2014 9:21 AM           Procedures:Procedures       Hospitalist   Signed by: Oswald Hillock   11/23/2014 7:27 AM

## 2014-11-23 NOTE — Progress Notes (Signed)
Pt found with left side paralysis, pupils continue to be different in size, right side 4mm, left side 2mm.  Also continues to c/o headache,  Dr. Ricka Burdock notified.  Victorino Dike NP at the bedside to evaluate patient.  See new orders.

## 2014-11-24 LAB — LYME DISEASE, WESTERN BLOT
Lyme Disease 18 kD IgG: NONREACTIVE
Lyme Disease 23 kD IgG: NONREACTIVE
Lyme Disease 23 kD IgM: REACTIVE — AB
Lyme Disease 28 kD IgG: NONREACTIVE
Lyme Disease 30 kD IgG: NONREACTIVE
Lyme Disease 39 kD IgG: NONREACTIVE
Lyme Disease 39 kD IgM: NONREACTIVE
Lyme Disease 41 kD IgG: REACTIVE — AB
Lyme Disease 41 kD IgM: NONREACTIVE
Lyme Disease 45 kD IgG: NONREACTIVE
Lyme Disease 58 kD IgG: NONREACTIVE
Lyme Disease 66 kD IgG: NONREACTIVE
Lyme Disease 93 kD IgG: NONREACTIVE
Lyme Disease Ab IgG, WB: NEGATIVE
Lyme Disease Ab IgM, WB: NEGATIVE

## 2014-11-24 LAB — CRYPTOCOCCAL ANTIGEN LATEX: Cryptococcal Ag Screen: NOT DETECTED

## 2014-11-24 LAB — HSV 1&2 DNA, PCR
HSV 1 DNA: NOT DETECTED
HSV 2 DNA: NOT DETECTED

## 2014-11-24 LAB — VARICELLA ZOSTER ANTIBODY, IGG: Varicella, IgG: 3.09

## 2014-11-25 LAB — VARICELLA ZOSTER ANTIBODY, IGM: VZV Ab IgM, EIA: <=0.9 (ref <=0.90)

## 2014-11-27 LAB — IGG, CSF (SOFT): Immunoglobulin G, CSF: 1.6 mg/dL (ref 0.8–7.7)

## 2014-11-27 LAB — CSF OLIGOCLONAL BANDS

## 2017-03-02 ENCOUNTER — Other Ambulatory Visit (INDEPENDENT_AMBULATORY_CARE_PROVIDER_SITE_OTHER): Payer: Self-pay | Admitting: Family

## 2018-07-13 ENCOUNTER — Other Ambulatory Visit (INDEPENDENT_AMBULATORY_CARE_PROVIDER_SITE_OTHER): Payer: Self-pay | Admitting: Family

## 2018-12-01 LAB — LIPID PANEL, WITHOUT TOTAL CHOLESTEROL/HDL RATIO, SERUM
Cholesterol: 163 mg/dL (ref 100–199)
HDL: 80 mg/dL (ref >39)
LDL Calculated: 69 mg/dL (ref 0–99)
Triglycerides: 72 mg/dL (ref 0–149)
VLDL Calculated: 14 mg/dL (ref 5–40)

## 2018-12-01 LAB — COMPREHENSIVE METABOLIC PANEL
ALT: 16 IU/L (ref 0–32)
AST (SGOT): 20 IU/L (ref 0–40)
Albumin/Globulin Ratio: 2 (ref 1.2–2.2)
Albumin: 4.6 g/dL (ref 3.5–5.5)
Alkaline Phosphatase: 56 IU/L (ref 39–117)
BUN / Creatinine Ratio: 16 (ref 9–23)
BUN: 13 mg/dL (ref 6–24)
Bilirubin, Total: 0.4 mg/dL (ref 0.0–1.2)
CO2: 23 mmol/L (ref 20–29)
Calcium: 9.7 mg/dL (ref 8.7–10.2)
Chloride: 99 mmol/L (ref 96–106)
Creatinine: 0.81 mg/dL (ref 0.57–1.00)
EGFR: 84 mL/min/{1.73_m2} (ref >59)
EGFR: 97 mL/min/{1.73_m2} (ref >59)
Globulin, Total: 2.3 g/dL (ref 1.5–4.5)
Glucose: 85 mg/dL (ref 65–99)
Potassium: 4.5 mmol/L (ref 3.5–5.2)
Protein, Total: 6.9 g/dL (ref 6.0–8.5)
Sodium: 139 mmol/L (ref 134–144)

## 2018-12-01 LAB — URINALYSIS WITH MICROSCOPIC
Bilirubin, UA: NEGATIVE
Blood, UA: NEGATIVE
Glucose, UA: NEGATIVE
Ketones UA: NEGATIVE
Nitrite, UA: NEGATIVE
Protein, UA: NEGATIVE
Urine Specific Gravity: 1.012 (ref 1.005–1.030)
Urobilinogen, Ur: 0.2 mg/dL (ref 0.2–1.0)
pH, UA: 7 (ref 5.0–7.5)

## 2018-12-01 LAB — REFLEX - MICROSCOPIC EXAMINATION
Bacteria, UA: NONE SEEN
Casts, UA: NONE SEEN /lpf

## 2018-12-01 LAB — THYROID PEROXIDASE ANTIBODY: Thyroid Peroxidase (TPO) AB: 10 IU/mL (ref 0–34)

## 2018-12-01 LAB — URINE CULTURE

## 2018-12-01 LAB — THYROID SCREEN
T4, Free: 1.12 ng/dL (ref 0.82–1.77)
TSH: 1.87 u[IU]/mL (ref 0.450–4.500)

## 2018-12-31 ENCOUNTER — Other Ambulatory Visit (INDEPENDENT_AMBULATORY_CARE_PROVIDER_SITE_OTHER): Payer: Self-pay | Admitting: Family

## 2019-01-11 LAB — STOOL OCCULT BLOOD, IMMUNOASSAY, (NOT GUAIAC BASED): Occult Blood, Fecal, IA: NEGATIVE

## 2019-01-19 LAB — COMPREHENSIVE METABOLIC PANEL
ALT: 12 IU/L (ref 0–32)
AST (SGOT): 14 IU/L (ref 0–40)
Albumin/Globulin Ratio: 2.6 — ABNORMAL HIGH (ref 1.2–2.2)
Albumin: 4.7 g/dL (ref 3.5–5.5)
Alkaline Phosphatase: 56 IU/L (ref 39–117)
BUN / Creatinine Ratio: 14 (ref 9–23)
BUN: 13 mg/dL (ref 6–24)
Bilirubin, Total: <0.2 mg/dL (ref 0.0–1.2)
CO2: 26 mmol/L (ref 20–29)
Calcium: 9.9 mg/dL (ref 8.7–10.2)
Chloride: 98 mmol/L (ref 96–106)
Creatinine: 0.91 mg/dL (ref 0.57–1.00)
EGFR: 72 mL/min/{1.73_m2} (ref >59)
EGFR: 83 mL/min/{1.73_m2} (ref >59)
Globulin, Total: 1.8 g/dL (ref 1.5–4.5)
Glucose: 77 mg/dL (ref 65–99)
Potassium: 4.6 mmol/L (ref 3.5–5.2)
Protein, Total: 6.5 g/dL (ref 6.0–8.5)
Sodium: 140 mmol/L (ref 134–144)

## 2019-01-19 LAB — LIPID PANEL, WITHOUT TOTAL CHOLESTEROL/HDL RATIO, SERUM
Cholesterol: 171 mg/dL (ref 100–199)
HDL: 77 mg/dL (ref >39)
LDL Calculated: 61 mg/dL (ref 0–99)
Triglycerides: 164 mg/dL — ABNORMAL HIGH (ref 0–149)
VLDL Calculated: 33 mg/dL (ref 5–40)

## 2019-01-19 LAB — THYROID SCREEN
T4, Free: 1.15 ng/dL (ref 0.82–1.77)
TSH: 1.63 u[IU]/mL (ref 0.450–4.500)

## 2019-01-19 LAB — T3: T3, Total: 196 ng/dL — ABNORMAL HIGH (ref 71–180)

## 2019-01-19 LAB — FSH AND LH
Follicle Stimulating Hormone: 15.4 m[IU]/mL
LH: 7.7 m[IU]/mL

## 2019-02-18 ENCOUNTER — Other Ambulatory Visit (INDEPENDENT_AMBULATORY_CARE_PROVIDER_SITE_OTHER): Payer: Self-pay | Admitting: Family

## 2019-04-27 ENCOUNTER — Other Ambulatory Visit: Payer: Self-pay

## 2019-04-28 ENCOUNTER — Encounter: Payer: Self-pay | Admitting: Family Medicine

## 2019-04-28 ENCOUNTER — Other Ambulatory Visit (HOSPITAL_COMMUNITY)
Admission: RE | Admit: 2019-04-28 | Discharge: 2019-04-28 | Disposition: A | Discharge status: Home / Self Care | Payer: Managed Care, Other (non HMO) | Source: Ambulatory Visit | Attending: Family Medicine | Admitting: Family Medicine

## 2019-04-28 ENCOUNTER — Ambulatory Visit (INDEPENDENT_AMBULATORY_CARE_PROVIDER_SITE_OTHER): Payer: Managed Care, Other (non HMO) | Admitting: Family Medicine

## 2019-04-28 VITALS — BP 140/80 | HR 84 | Temp 97.1°F | Ht 67.0 in | Wt 166.2 lb

## 2019-04-28 DIAGNOSIS — Z0000 Encounter for general adult medical examination without abnormal findings: Secondary | ICD-10-CM | POA: Diagnosis not present

## 2019-04-28 DIAGNOSIS — Z803 Family history of malignant neoplasm of breast: Secondary | ICD-10-CM | POA: Diagnosis not present

## 2019-04-28 DIAGNOSIS — R194 Change in bowel habit: Secondary | ICD-10-CM

## 2019-04-28 DIAGNOSIS — Z124 Encounter for screening for malignant neoplasm of cervix: Secondary | ICD-10-CM | POA: Diagnosis not present

## 2019-04-28 DIAGNOSIS — K5900 Constipation, unspecified: Secondary | ICD-10-CM

## 2019-04-28 DIAGNOSIS — Z1231 Encounter for screening mammogram for malignant neoplasm of breast: Secondary | ICD-10-CM

## 2019-04-28 DIAGNOSIS — Z1211 Encounter for screening for malignant neoplasm of colon: Secondary | ICD-10-CM

## 2019-04-28 NOTE — Progress Notes (Signed)
Melissa Warner is a 53 y.o. female  Chief Complaint  Patient presents with  . Establish Care    pap,cologuard,mamm cpe X fasting/ pt having stomach pain and change in BM thinks she may have some type of bowel obstruc.    HPI: Melissa Warner is a 53 y.o. female here to establish care and for annual CPE. She has not yet eaten today and is agreeable to labs. She is due for PAP.  She complains of 6 week h/o decreased appetite, intermittent nausea with 2 episodes of vomiting. Epigastric abdominal gas pain and bloating. She was having 1 soft large BM per day. Now she has not had a BM in 3 days. When she does go, stools are loose. No blood in stool. No fever, chills.   She takes probiotic and 500mg  magnesium nightly.   Last PAP: 5 years ago Last mammo: due in 03/2019 Last colonoscopy: 10 years ago - due   She had positive COVID antibody test in 12/2018.   Past Medical History:  Diagnosis Date  . Depression   . Lyme disease     Past Surgical History:  Procedure Laterality Date  . CESAREAN SECTION      Social History   Socioeconomic History  . Marital status: Married    Spouse name: Not on file  . Number of children: Not on file  . Years of education: Not on file  . Highest education level: Not on file  Occupational History  . Not on file  Social Needs  . Financial resource strain: Not on file  . Food insecurity    Worry: Not on file    Inability: Not on file  . Transportation needs    Medical: Not on file    Non-medical: Not on file  Tobacco Use  . Smoking status: Never Smoker  . Smokeless tobacco: Never Used  Substance and Sexual Activity  . Alcohol use: Yes  . Drug use: Never  . Sexual activity: Not on file  Lifestyle  . Physical activity    Days per week: Not on file    Minutes per session: Not on file  . Stress: Not on file  Relationships  . Social 01/2019 on phone: Not on file    Gets together: Not on file    Attends religious service: Not on file     Active member of club or organization: Not on file    Attends meetings of clubs or organizations: Not on file    Relationship status: Not on file  . Intimate partner violence    Fear of current or ex partner: Not on file    Emotionally abused: Not on file    Physically abused: Not on file    Forced sexual activity: Not on file  Other Topics Concern  . Not on file  Social History Narrative  . Not on file    Family History  Problem Relation Age of Onset  . Cancer Mother   . Cancer Brother      Immunization History  Administered Date(s) Administered  . Influenza,inj,Quad PF,6+ Mos 02/07/2019    Outpatient Encounter Medications as of 04/28/2019  Medication Sig  . venlafaxine XR (EFFEXOR-XR) 75 MG 24 hr capsule   . zolpidem (AMBIEN) 10 MG tablet Take by mouth.  13/05/2019 FLUoxetine (PROZAC) 20 MG capsule Take 20 mg by mouth daily.  Marland Kitchen NAPROXEN DR 500 MG EC tablet Take 500 mg by mouth 2 (two) times daily.   No  facility-administered encounter medications on file as of 04/28/2019.      ROS: Pertinent positives and negatives noted in HPI. Remainder of ROS non-contributory  No Known Allergies  Pulse 84   Temp (!) 97.1 F (36.2 C) (Temporal)   Ht 5\' 7"  (1.702 m)   Wt 166 lb 3.2 oz (75.4 kg)   SpO2 98%   BMI 26.03 kg/m   Physical Exam  Constitutional: She is oriented to person, place, and time. She appears well-developed and well-nourished. No distress.  HENT:  Head: Normocephalic and atraumatic.  Right Ear: Tympanic membrane and ear canal normal.  Left Ear: Tympanic membrane and ear canal normal.  Nose: Nose normal.  Mouth/Throat: Oropharynx is clear and moist and mucous membranes are normal.  Eyes: Pupils are equal, round, and reactive to light. Conjunctivae are normal.  Neck: Neck supple. No thyromegaly present.  Cardiovascular: Normal rate, regular rhythm, normal heart sounds and intact distal pulses.  No murmur heard. Pulmonary/Chest: Effort normal and breath sounds  normal. No respiratory distress. She has no wheezes. She has no rhonchi. Right breast exhibits no mass, no nipple discharge, no skin change and no tenderness. Left breast exhibits no mass, no nipple discharge, no skin change and no tenderness.  Abdominal: Soft. Bowel sounds are normal. She exhibits no distension and no mass. There is no abdominal tenderness.  Genitourinary:    Uterus normal.  There is no rash or lesion on the right labia. There is no rash or lesion on the left labia. Cervix exhibits no motion tenderness and no friability. Right adnexum displays no mass, no tenderness and no fullness. Left adnexum displays no mass, no tenderness and no fullness.    Vaginal bleeding (scant blood (pt states menstrual cycle is ending)) present.  There is bleeding (scant blood (pt states menstrual cycle is ending)) in the vagina.  Musculoskeletal:        General: No edema.  Lymphadenopathy:    She has no cervical adenopathy.  Neurological: She is alert and oriented to person, place, and time. She exhibits normal muscle tone. Coordination normal.  Skin: Skin is warm and dry.  Psychiatric: She has a normal mood and affect. Her behavior is normal.     A/P:  1. Annual physical exam - due for PAP, mammo, colonoscopy - due for dental exam - discussed importance of regular CV exercise, healthy diet, adequate sleep - immunizations UTD - ALT - AST - Basic metabolic panel - Lipid panel - VITAMIN D 25 Hydroxy (Vit-D Deficiency, Fractures) - next CPE in 1 year  2. Encounter for screening mammogram for malignant neoplasm of breast 3. Family history of breast cancer in mother - MM DIGITAL SCREENING BILATERAL; Future  4. Screening for cervical cancer - Cytology - PAP( [place])  5. Screening for colon cancer - Colonoscopy Referral   6. Constipation, unspecified constipation type 7. Change in bowel habits - symptoms x 5-6 weeks; pt is having smaller and looser than normal BM every 3-4 days when  she was previously having 1 large, soft BM daily - could be having overflow diarrhea d/t constipation - recommend miralax BID, colace BID to hopefully empty colon then d/c colace and reduce miralax to daily - increase water intake, cont with regular exercise - Colonoscopy Referral

## 2019-04-28 NOTE — Patient Instructions (Addendum)
[facility]  [email or web address] [address], [place] [address] P: [phone] F: [phone] [email or web address]  [facility] [email or web address] [address], [place], [place] [address] fax: [phone] [email or web address]   miralax 1 capful in 6-8oz water 2x/day  Colace 100mg  1 tab twice per day Goal is to have soft, large volume stools 1-2x/day Then stop colace and cut down on miralax to 1 cap daily High fiber diet or fiber supplement Water 48-64oz per day Colonoscopy referral placed

## 2019-04-29 LAB — BASIC METABOLIC PANEL
BUN/Creatinine Ratio: 18 (ref 9–23)
BUN: 14 mg/dL (ref 6–24)
CO2: 26 mmol/L (ref 20–29)
Calcium: 9.5 mg/dL (ref 8.7–10.2)
Chloride: 101 mmol/L (ref 96–106)
Creatinine, Ser: 0.77 mg/dL (ref 0.57–1.00)
GFR calc Af Amer: 101 mL/min/{1.73_m2} (ref >59)
GFR calc non Af Amer: 88 mL/min/{1.73_m2} (ref >59)
Glucose: 94 mg/dL (ref 65–99)
Potassium: 4.1 mmol/L (ref 3.5–5.2)
Sodium: 139 mmol/L (ref 134–144)

## 2019-04-29 LAB — ALT: ALT: 13 IU/L (ref 0–32)

## 2019-04-29 LAB — VITAMIN D 25 HYDROXY (VIT D DEFICIENCY, FRACTURES): Vit D, 25-Hydroxy: 24.1 ng/mL — ABNORMAL LOW (ref 30.0–100.0)

## 2019-04-29 LAB — AST: AST: 25 IU/L (ref 0–40)

## 2019-04-29 LAB — LIPID PANEL
Chol/HDL Ratio: 2.5 ratio (ref 0.0–4.4)
Cholesterol, Total: 166 mg/dL (ref 100–199)
HDL: 67 mg/dL (ref >39)
LDL Chol Calc (NIH): 79 mg/dL (ref 0–99)
Triglycerides: 114 mg/dL (ref 0–149)
VLDL Cholesterol Cal: 20 mg/dL (ref 5–40)

## 2019-05-03 ENCOUNTER — Telehealth: Payer: Self-pay | Admitting: Family Medicine

## 2019-05-03 NOTE — Telephone Encounter (Signed)
Results given by [facility] triage nurse and documented in result note.

## 2019-05-03 NOTE — Telephone Encounter (Signed)
Pt returned vm for lab results. TE unavailable. Please return call.  [phone]   Copied from [place] [phone]. Topic: Quick Communication - Lab Results (Clinic Use ONLY) >> May 03, 2019  3:48 PM Rodrigo Ran, CMA wrote: Called patient to inform them of her lab results. When patient returns call, triage nurse may disclose results.

## 2019-05-06 LAB — CYTOLOGY - PAP
Comment: NEGATIVE
Diagnosis: NEGATIVE
High risk HPV: NEGATIVE

## 2019-06-24 ENCOUNTER — Ambulatory Visit: Payer: Managed Care, Other (non HMO)

## 2019-07-05 ENCOUNTER — Encounter: Payer: Self-pay | Admitting: Family Medicine

## 2019-07-26 ENCOUNTER — Other Ambulatory Visit: Payer: Self-pay | Admitting: *Deleted

## 2019-07-26 DIAGNOSIS — F419 Anxiety disorder, unspecified: Secondary | ICD-10-CM | POA: Insufficient documentation

## 2019-07-26 DIAGNOSIS — N946 Dysmenorrhea, unspecified: Secondary | ICD-10-CM | POA: Insufficient documentation

## 2019-07-28 ENCOUNTER — Ambulatory Visit (AMBULATORY_SURGERY_CENTER): Payer: Self-pay | Admitting: *Deleted

## 2019-07-28 ENCOUNTER — Other Ambulatory Visit: Payer: Self-pay

## 2019-07-28 VITALS — Temp 96.6°F | Ht 67.0 in | Wt 160.0 lb

## 2019-07-28 DIAGNOSIS — Z1211 Encounter for screening for malignant neoplasm of colon: Secondary | ICD-10-CM

## 2019-07-28 MED ORDER — NA SULFATE-K SULFATE-MG SULF 17.5-3.13-1.6 GM/177ML PO SOLN: 1.0000 | Freq: Once | ORAL | 0 refills | Status: AC

## 2019-07-28 NOTE — Progress Notes (Signed)

## 2019-08-02 ENCOUNTER — Other Ambulatory Visit: Payer: Self-pay

## 2019-08-02 ENCOUNTER — Ambulatory Visit
Admission: RE | Admit: 2019-08-02 | Discharge: 2019-08-02 | Disposition: A | Discharge status: Home / Self Care | Payer: Managed Care, Other (non HMO) | Source: Ambulatory Visit | Attending: Family Medicine | Admitting: Family Medicine

## 2019-08-02 ENCOUNTER — Other Ambulatory Visit: Payer: Self-pay | Admitting: Gastroenterology

## 2019-08-02 ENCOUNTER — Telehealth: Payer: Self-pay | Admitting: Gastroenterology

## 2019-08-02 DIAGNOSIS — Z803 Family history of malignant neoplasm of breast: Secondary | ICD-10-CM

## 2019-08-02 DIAGNOSIS — Z1231 Encounter for screening mammogram for malignant neoplasm of breast: Secondary | ICD-10-CM

## 2019-08-02 MED ORDER — CLENPIQ 10-3.5-12 MG-GM -GM/160ML PO SOLN: 1.0000 | Freq: Once | ORAL | 0 refills | Status: AC

## 2019-08-02 NOTE — Telephone Encounter (Signed)
Patient called requesting a change on her prep she would like Clenpiq sent to [facility] [phone].

## 2019-08-02 NOTE — Telephone Encounter (Signed)
Spoke to patient who stated she would like Clenpiq prep for her colonoscopy rather then the Suprep that was prescribed. Clempiq sent to her pharmacy. Patient will view new instructions through MyChart. She will provide proof of Covid testing the day of procedure,that was apparently discussed during her pre visit appointment.

## 2019-08-03 ENCOUNTER — Encounter: Payer: Self-pay | Admitting: Gastroenterology

## 2019-08-04 ENCOUNTER — Encounter: Payer: Managed Care, Other (non HMO) | Admitting: Gastroenterology

## 2019-08-26 ENCOUNTER — Encounter: Payer: Self-pay | Admitting: Gastroenterology

## 2019-08-26 ENCOUNTER — Ambulatory Visit (AMBULATORY_SURGERY_CENTER): Payer: Managed Care, Other (non HMO) | Admitting: Gastroenterology

## 2019-08-26 ENCOUNTER — Telehealth: Payer: Self-pay | Admitting: Family Medicine

## 2019-08-26 ENCOUNTER — Encounter: Payer: Self-pay | Admitting: Family Medicine

## 2019-08-26 ENCOUNTER — Other Ambulatory Visit: Payer: Self-pay

## 2019-08-26 VITALS — BP 122/68 | HR 74 | Temp 97.1°F | Resp 15 | Ht 67.0 in | Wt 160.0 lb

## 2019-08-26 DIAGNOSIS — Z1211 Encounter for screening for malignant neoplasm of colon: Secondary | ICD-10-CM

## 2019-08-26 DIAGNOSIS — K5730 Diverticulosis of large intestine without perforation or abscess without bleeding: Secondary | ICD-10-CM

## 2019-08-26 DIAGNOSIS — K640 First degree hemorrhoids: Secondary | ICD-10-CM

## 2019-08-26 MED ORDER — ZOLPIDEM TARTRATE 10 MG PO TABS: 10.0000 mg | ORAL_TABLET | Freq: Every evening | ORAL | 0 refills | Status: DC | PRN

## 2019-08-26 MED ORDER — VENLAFAXINE HCL ER 75 MG PO CP24: 75.0000 mg | ORAL_CAPSULE | Freq: Every day | ORAL | 3 refills | Status: DC

## 2019-08-26 MED ORDER — NAPROXEN 500 MG PO TABS: 500.0000 mg | ORAL_TABLET | Freq: Two times a day (BID) | ORAL | 0 refills | Status: DC

## 2019-08-26 MED ORDER — SODIUM CHLORIDE 0.9 % IV SOLN: 500.0000 mL | Freq: Once | INTRAVENOUS | Status: DC

## 2019-08-26 MED ORDER — FLUOXETINE HCL 20 MG PO CAPS: 20.0000 mg | ORAL_CAPSULE | Freq: Every day | ORAL | 3 refills | Status: DC

## 2019-08-26 NOTE — Progress Notes (Signed)
Pt's states no medical or surgical changes since previsit or office visit. 

## 2019-08-26 NOTE — Telephone Encounter (Signed)
LDM on vm for pt to call office.

## 2019-08-26 NOTE — Telephone Encounter (Signed)
Please see message and advise.  Thank you. ° °

## 2019-08-26 NOTE — Patient Instructions (Signed)
YOU HAD AN ENDOSCOPIC PROCEDURE TODAY AT THE [facility]:   Refer to the procedure report that was given to you for any specific questions about what was found during the examination.  If the procedure report does not answer your questions, please call your gastroenterologist to clarify.  If you requested that your care partner not be given the details of your procedure findings, then the procedure report has been included in a sealed envelope for you to review at your convenience later.  YOU SHOULD EXPECT: Some feelings of bloating in the abdomen. Passage of more gas than usual.  Walking can help get rid of the air that was put into your GI tract during the procedure and reduce the bloating. If you had a lower endoscopy (such as a colonoscopy or flexible sigmoidoscopy) you may notice spotting of blood in your stool or on the toilet paper. If you underwent a bowel prep for your procedure, you may not have a normal bowel movement for a few days.  Please Note:  You might notice some irritation and congestion in your nose or some drainage.  This is from the oxygen used during your procedure.  There is no need for concern and it should clear up in a day or so.  SYMPTOMS TO REPORT IMMEDIATELY:   Following lower endoscopy (colonoscopy or flexible sigmoidoscopy):  Excessive amounts of blood in the stool  Significant tenderness or worsening of abdominal pains  Swelling of the abdomen that is new, acute  Fever of 100F or higher   For urgent or emergent issues, a gastroenterologist can be reached at any hour by calling [phone]. Do not use MyChart messaging for urgent concerns.    DIET:  We do recommend a small meal at first, but then you may proceed to your regular diet.  Drink plenty of fluids but you should avoid alcoholic beverages for 24 hours.  MEDICATIONS: Continue present medications.  Please see handouts given to you by your recovery nurse.  ACTIVITY:  You should plan to  take it easy for the rest of today and you should NOT DRIVE or use heavy machinery until tomorrow (because of the sedation medicines used during the test).    FOLLOW UP: Our staff will call the number listed on your records 48-72 hours following your procedure to check on you and address any questions or concerns that you may have regarding the information given to you following your procedure. If we do not reach you, we will leave a message.  We will attempt to reach you two times.  During this call, we will ask if you have developed any symptoms of COVID 19. If you develop any symptoms (ie: fever, flu-like symptoms, shortness of breath, cough etc.) before then, please call [phone].  If you test positive for Covid 19 in the 2 weeks post procedure, please call and report this information to us.    If any biopsies were taken you will be contacted by phone or by letter within the next 1-3 weeks.  Please call us at [phone] if you have not heard about the biopsies in 3 weeks.   Thank you for allowing us to provide for your healthcare needs today.   SIGNATURES/CONFIDENTIALITY: You and/or your care partner have signed paperwork which will be entered into your electronic medical record.  These signatures attest to the fact that that the information above on your After Visit Summary has been reviewed and is understood.  Full responsibility of the   confidentiality of this discharge information lies with you and/or your care-partner. 

## 2019-08-26 NOTE — Progress Notes (Signed)
PT taken to PACU. Monitors in place. VSS. Report given to RN. 

## 2019-08-26 NOTE — Telephone Encounter (Signed)
-----   Message from [facility] V, DO sent at 08/26/2019  9:33 AM EST ----- Regarding: Med refill Hey there. Just did colonoscopy on this mutual patient (all normal). She asked in recovery if I could send a message along to you requesting refill of "maintenance meds". I said I would pass this along but that your office would reach out to confirm which meds she needs refills on.   Thanks!

## 2019-08-26 NOTE — Telephone Encounter (Signed)
Rx fill via Mychart.

## 2019-08-26 NOTE — Telephone Encounter (Signed)
Please call pt to find out which meds she needs refills of so we can send to her pharm. Thank you!

## 2019-08-26 NOTE — Op Note (Signed)
[facility] Patient Name: Melissa Warner Procedure Date: 08/26/2019 8:51 AM MRN: [id number] Endoscopist: Doristine Locks , MD Age: 54 Referring MD:  Date of Birth: [birth year] Gender: Female Account #: [id number] Procedure:                Colonoscopy Indications:              Screening for colorectal malignant neoplasm Medicines:                Monitored Anesthesia Care Procedure:                Pre-Anesthesia Assessment:                           - Prior to the procedure, a History and Physical                            was performed, and patient medications and                            allergies were reviewed. The patient's tolerance of                            previous anesthesia was also reviewed. The risks                            and benefits of the procedure and the sedation                            options and risks were discussed with the patient.                            All questions were answered, and informed consent                            was obtained. Prior Anticoagulants: The patient has                            taken no previous anticoagulant or antiplatelet                            agents. ASA Grade Assessment: II - A patient with                            mild systemic disease. After reviewing the risks                            and benefits, the patient was deemed in                            satisfactory condition to undergo the procedure.                           After obtaining informed consent, the colonoscope  was passed under direct vision. Throughout the                            procedure, the patient's blood pressure, pulse, and                            oxygen saturations were monitored continuously. The                            Colonoscope was introduced through the anus and                            advanced to the the terminal ileum. The colonoscopy                            was performed  without difficulty. The patient                            tolerated the procedure well. The quality of the                            bowel preparation was adequate. The terminal ileum,                            ileocecal valve, appendiceal orifice, and rectum                            were photographed. Scope In: 9:02:33 AM Scope Out: 9:20:02 AM Scope Withdrawal Time: 0 hours 13 minutes 54 seconds  Total Procedure Duration: 0 hours 17 minutes 29 seconds  Findings:                 The perianal and digital rectal examinations were                            normal.                           A few small-mouthed diverticula were found in the                            sigmoid colon.                           Non-bleeding internal hemorrhoids were found during                            retroflexion. The hemorrhoids were small.                           The exam was otherwise normal throughout the                            remainder of the colon.  The terminal ileum appeared normal. Complications:            No immediate complications. Estimated Blood Loss:     Estimated blood loss: none. Impression:               - Diverticulosis in the sigmoid colon.                           - Non-bleeding internal hemorrhoids.                           - The examined portion of the ileum was normal.                           - No specimens collected. Recommendation:           - Patient has a contact number available for                            emergencies. The signs and symptoms of potential                            delayed complications were discussed with the                            patient. Return to normal activities tomorrow.                            Written discharge instructions were provided to the                            patient.                           - Resume previous diet.                           - Continue present medications.                            - Repeat colonoscopy in 10 years for screening                            purposes.                           - Return to GI office PRN. Doristine Locks, MD 08/26/2019 9:24:41 AM

## 2019-08-30 ENCOUNTER — Telehealth: Payer: Self-pay | Admitting: *Deleted

## 2019-08-30 ENCOUNTER — Telehealth: Payer: Self-pay

## 2019-08-30 NOTE — Telephone Encounter (Signed)
No answer for post procedure call back. Left message for patient to call with questions or concerns. 

## 2019-08-30 NOTE — Telephone Encounter (Signed)
LVM

## 2019-09-29 ENCOUNTER — Other Ambulatory Visit (INDEPENDENT_AMBULATORY_CARE_PROVIDER_SITE_OTHER): Payer: Self-pay | Admitting: Family Medicine

## 2019-11-17 ENCOUNTER — Other Ambulatory Visit: Payer: Self-pay | Admitting: Family Medicine

## 2019-11-17 NOTE — Telephone Encounter (Signed)
MC-Plz see refill req/thx dmf 

## 2020-02-08 ENCOUNTER — Other Ambulatory Visit: Payer: Self-pay

## 2020-02-08 ENCOUNTER — Ambulatory Visit (INDEPENDENT_AMBULATORY_CARE_PROVIDER_SITE_OTHER): Payer: Managed Care, Other (non HMO) | Admitting: Family Medicine

## 2020-02-08 ENCOUNTER — Encounter: Payer: Self-pay | Admitting: Family Medicine

## 2020-02-08 VITALS — BP 116/76 | HR 82 | Temp 98.7°F | Ht 67.0 in | Wt 164.2 lb

## 2020-02-08 DIAGNOSIS — G4700 Insomnia, unspecified: Secondary | ICD-10-CM | POA: Diagnosis not present

## 2020-02-08 DIAGNOSIS — F419 Anxiety disorder, unspecified: Secondary | ICD-10-CM

## 2020-02-08 DIAGNOSIS — Z79899 Other long term (current) drug therapy: Secondary | ICD-10-CM | POA: Diagnosis not present

## 2020-02-08 MED ORDER — VENLAFAXINE HCL ER 75 MG PO CP24: 75.0000 mg | ORAL_CAPSULE | Freq: Every day | ORAL | 3 refills | Status: DC

## 2020-02-08 MED ORDER — NAPROXEN 500 MG PO TABS
500.0000 mg | ORAL_TABLET | Freq: Two times a day (BID) | ORAL | 1 refills | Status: DC
Start: 2020-02-08 — End: 2020-10-22

## 2020-02-08 MED ORDER — ZOLPIDEM TARTRATE 10 MG PO TABS: 10.0000 mg | ORAL_TABLET | Freq: Every evening | ORAL | 0 refills | Status: DC | PRN

## 2020-02-08 MED ORDER — FLUOXETINE HCL 20 MG PO CAPS: 20.0000 mg | ORAL_CAPSULE | Freq: Every day | ORAL | 3 refills | Status: DC

## 2020-02-08 NOTE — Progress Notes (Signed)
Melissa Warner is a 54 y.o. female  Chief Complaint  Patient presents with  . Follow-up    med refills on all meds//[facility] to [facility] on [address]    HPI: Melissa Warner is a 54 y.o. female here today for routine f/u on chronic medical issues of anxiety and insomnia and medication refills.  Meds are effective. No side effects. Sleep is good with me. Appetite is normal. No HA, vision changes, dizziness, CP, SOB, numbness or paresthesias.  Past Medical History:  Diagnosis Date  . Depression   . Lyme disease     Past Surgical History:  Procedure Laterality Date  . BREAST EXCISIONAL BIOPSY Right 2009   benign  . CESAREAN SECTION      Social History   Socioeconomic History  . Marital status: Married    Spouse name: Not on file  . Number of children: Not on file  . Years of education: Not on file  . Highest education level: Not on file  Occupational History  . Not on file  Tobacco Use  . Smoking status: Never Smoker  . Smokeless tobacco: Never Used  Vaping Use  . Vaping Use: Never used  Substance and Sexual Activity  . Alcohol use: Yes  . Drug use: Never  . Sexual activity: Not on file  Other Topics Concern  . Not on file  Social History Narrative  . Not on file   Social Determinants of Health   Financial Resource Strain:   . Difficulty of Paying Living Expenses: Not on file  Food Insecurity:   . Worried About Programme researcher, broadcasting/film/video in the Last Year: Not on file  . Ran Out of Food in the Last Year: Not on file  Transportation Needs:   . Lack of Transportation (Medical): Not on file  . Lack of Transportation (Non-Medical): Not on file  Physical Activity:   . Days of Exercise per Week: Not on file  . Minutes of Exercise per Session: Not on file  Stress:   . Feeling of Stress : Not on file  Social Connections:   . Frequency of Communication with Friends and Family: Not on file  . Frequency of Social Gatherings with Friends and Family: Not on file  . Attends  Religious Services: Not on file  . Active Member of Clubs or Organizations: Not on file  . Attends Banker Meetings: Not on file  . Marital Status: Not on file  Intimate Partner Violence:   . Fear of Current or Ex-Partner: Not on file  . Emotionally Abused: Not on file  . Physically Abused: Not on file  . Sexually Abused: Not on file    Family History  Problem Relation Age of Onset  . Cancer Mother   . Breast cancer Mother 49  . Cancer Brother   . Pancreatic cancer Brother   . Colon cancer Neg Hx   . Esophageal cancer Neg Hx   . Stomach cancer Neg Hx   . Rectal cancer Neg Hx      Immunization History  Administered Date(s) Administered  . Influenza,inj,Quad PF,6+ Mos 02/07/2019    Outpatient Encounter Medications as of 02/08/2020  Medication Sig  . FLUoxetine (PROZAC) 20 MG capsule Take 1 capsule (20 mg total) by mouth daily.  . naproxen (NAPROSYN) 500 MG tablet TAKE 1 TABLET TWICE A DAY  WITH A MEAL  . venlafaxine XR (EFFEXOR-XR) 75 MG 24 hr capsule Take 1 capsule (75 mg total) by mouth daily with breakfast.  .  zolpidem (AMBIEN) 10 MG tablet TAKE 1 TABLET AT BEDTIME   AS NEEDED FOR SLEEP   Facility-Administered Encounter Medications as of 02/08/2020  Medication  . 0.9 %  sodium chloride infusion     ROS: Pertinent positives and negatives noted in HPI. Remainder of ROS non-contributory    No Known Allergies  BP 116/76   Pulse 82   Temp 98.7 F (37.1 C) (Tympanic)   Ht 5\' 7"  (1.702 m)   Wt 164 lb 3.2 oz (74.5 kg)   SpO2 99%   BMI 25.72 kg/m   Physical Exam Constitutional:      General: She is not in acute distress.    Appearance: Normal appearance. She is not ill-appearing.  Cardiovascular:     Rate and Rhythm: Normal rate and regular rhythm.     Pulses: Normal pulses.  Pulmonary:     Effort: Pulmonary effort is normal. No respiratory distress.     Breath sounds: Normal breath sounds.  Musculoskeletal:     Right lower leg: No edema.      Left lower leg: No edema.  Neurological:     General: No focal deficit present.     Mental Status: She is alert and oriented to person, place, and time.  Psychiatric:        Mood and Affect: Mood normal.        Behavior: Behavior normal.      A/P:  1. Anxiety - controlled - database reviewed and appropriate - controlled substance agreement updated today Refill: - FLUoxetine (PROZAC) 20 MG capsule; Take 1 capsule (20 mg total) by mouth daily.  Dispense: 90 capsule; Refill: 3 - venlafaxine XR (EFFEXOR-XR) 75 MG 24 hr capsule; Take 1 capsule (75 mg total) by mouth daily with breakfast.  Dispense: 90 capsule; Refill: 3  2. Insomnia, unspecified type - controlled - database reviewed and appropriate - DRUG MONITORING, PANEL 8 WITH CONFIRMATION, URINE - controlled substance agreement updated today Refill: - zolpidem (AMBIEN) 10 MG tablet; Take 1 tablet (10 mg total) by mouth at bedtime as needed. for sleep  Dispense: 90 tablet; Refill: 0 - f/u in 3 mo or sooner PRN  3. High risk medications (not anticoagulants) long-term use - DRUG MONITORING, PANEL 8 WITH CONFIRMATION, URINE   This visit occurred during the SARS-CoV-2 public health emergency.  Safety protocols were in place, including screening questions prior to the visit, additional usage of staff PPE, and extensive cleaning of exam room while observing appropriate contact time as indicated for disinfecting solutions.

## 2020-02-10 LAB — DRUG MONITORING, PANEL 8 WITH CONFIRMATION, URINE
6 Acetylmorphine: NEGATIVE ng/mL (ref <10)
Alcohol Metabolites: POSITIVE ng/mL — AB
Amphetamines: NEGATIVE ng/mL (ref <500)
Benzodiazepines: NEGATIVE ng/mL (ref <100)
Buprenorphine, Urine: NEGATIVE ng/mL (ref <5)
Cocaine Metabolite: NEGATIVE ng/mL (ref <150)
Creatinine: 29 mg/dL
Ethyl Glucuronide (ETG): 699 ng/mL — ABNORMAL HIGH (ref <500)
Ethyl Sulfate (ETS): 145 ng/mL — ABNORMAL HIGH (ref <100)
MDMA: NEGATIVE ng/mL (ref <500)
Marijuana Metabolite: NEGATIVE ng/mL (ref <20)
Opiates: NEGATIVE ng/mL (ref <100)
Oxidant: NEGATIVE ug/mL
Oxycodone: NEGATIVE ng/mL (ref <100)
pH: 7.5 (ref 4.5–9.0)

## 2020-02-10 LAB — DM TEMPLATE

## 2020-05-17 ENCOUNTER — Other Ambulatory Visit: Payer: Self-pay | Admitting: Family Medicine

## 2020-05-17 DIAGNOSIS — G4700 Insomnia, unspecified: Secondary | ICD-10-CM

## 2020-05-18 NOTE — Telephone Encounter (Signed)
Refill request for:  Ambien 10 mg LR 02/08/20, #90, o rf LOV 02/08/20 FOV 05/23/20  Please review and advise.  Thanks.  Dm/cma

## 2020-05-22 ENCOUNTER — Other Ambulatory Visit: Payer: Self-pay

## 2020-05-23 ENCOUNTER — Ambulatory Visit (INDEPENDENT_AMBULATORY_CARE_PROVIDER_SITE_OTHER): Payer: Managed Care, Other (non HMO) | Admitting: Family Medicine

## 2020-05-23 ENCOUNTER — Encounter: Payer: Self-pay | Admitting: Family Medicine

## 2020-05-23 VITALS — BP 116/70 | HR 86 | Temp 97.8°F | Ht 67.0 in | Wt 165.0 lb

## 2020-05-23 DIAGNOSIS — G4700 Insomnia, unspecified: Secondary | ICD-10-CM | POA: Diagnosis not present

## 2020-05-23 DIAGNOSIS — F419 Anxiety disorder, unspecified: Secondary | ICD-10-CM | POA: Diagnosis not present

## 2020-05-23 DIAGNOSIS — Z1321 Encounter for screening for nutritional disorder: Secondary | ICD-10-CM

## 2020-05-23 DIAGNOSIS — Z0000 Encounter for general adult medical examination without abnormal findings: Secondary | ICD-10-CM | POA: Diagnosis not present

## 2020-05-23 LAB — LIPID PANEL
Cholesterol: 186 mg/dL (ref 0–200)
HDL: 85.8 mg/dL (ref >39.00)
LDL Cholesterol: 88 mg/dL (ref 0–99)
NonHDL: 99.8
Total CHOL/HDL Ratio: 2
Triglycerides: 59 mg/dL (ref 0.0–149.0)
VLDL: 11.8 mg/dL (ref 0.0–40.0)

## 2020-05-23 LAB — BASIC METABOLIC PANEL
BUN: 11 mg/dL (ref 6–23)
CO2: 32 mEq/L (ref 19–32)
Calcium: 9.8 mg/dL (ref 8.4–10.5)
Chloride: 101 mEq/L (ref 96–112)
Creatinine, Ser: 0.89 mg/dL (ref 0.40–1.20)
GFR: 72.81 mL/min (ref >60.00)
Glucose, Bld: 87 mg/dL (ref 70–99)
Potassium: 4.2 mEq/L (ref 3.5–5.1)
Sodium: 139 mEq/L (ref 135–145)

## 2020-05-23 LAB — VITAMIN D 25 HYDROXY (VIT D DEFICIENCY, FRACTURES): VITD: 83.03 ng/mL (ref 30.00–100.00)

## 2020-05-23 LAB — CBC
HCT: 39.6 % (ref 36.0–46.0)
Hemoglobin: 13.1 g/dL (ref 12.0–15.0)
MCHC: 33 g/dL (ref 30.0–36.0)
MCV: 93.1 fl (ref 78.0–100.0)
Platelets: 237 10*3/uL (ref 150.0–400.0)
RBC: 4.26 Mil/uL (ref 3.87–5.11)
RDW: 13.1 % (ref 11.5–15.5)
WBC: 5.5 10*3/uL (ref 4.0–10.5)

## 2020-05-23 LAB — AST: AST: 21 U/L (ref 0–37)

## 2020-05-23 LAB — ALT: ALT: 19 U/L (ref 0–35)

## 2020-05-23 MED ORDER — FLUOXETINE HCL 20 MG PO CAPS: 20.0000 mg | ORAL_CAPSULE | Freq: Every day | ORAL | 3 refills | Status: DC

## 2020-05-23 MED ORDER — VENLAFAXINE HCL ER 150 MG PO CP24: 150.0000 mg | ORAL_CAPSULE | Freq: Every day | ORAL | 3 refills | Status: DC

## 2020-05-23 NOTE — Progress Notes (Signed)
Melissa Warner is a 54 y.o. female  Chief Complaint  Patient presents with  . Annual Exam    CPE/labs. fasting today.  no concerns.      HPI: *Melissa Warner is a 54 y.o. female seen today for annual CPE, fasting labs.  She would like to increase her effexor from 75mg  daily to 150mg  daily during the winter months as she typically feels more down during these months.   Last PAP: 04/2019 - normal Last mammo: 08/2019 Last colonoscopy: 08/2019 - Dr. 09/2019 - due in 08/2029  Diet/Exercise: overall healthy, well-balanced; not much in the way of regular exercise, does some yoga Dental: UTD on exam Vision: has appt scheduled  Med refills needed today?   Past Medical History:  Diagnosis Date  . Depression   . Lyme disease     Past Surgical History:  Procedure Laterality Date  . BREAST EXCISIONAL BIOPSY Right 2009   benign  . CESAREAN SECTION      Social History   Socioeconomic History  . Marital status: Married    Spouse name: Not on file  . Number of children: Not on file  . Years of education: Not on file  . Highest education level: Not on file  Occupational History  . Not on file  Tobacco Use  . Smoking status: Never Smoker  . Smokeless tobacco: Never Used  Vaping Use  . Vaping Use: Never used  Substance and Sexual Activity  . Alcohol use: Yes    Alcohol/week: 1.0 standard drink    Types: 1 Glasses of wine per week    Comment: social  . Drug use: Never  . Sexual activity: Yes  Other Topics Concern  . Not on file  Social History Narrative  . Not on file   Social Determinants of Health   Financial Resource Strain:   . Difficulty of Paying Living Expenses: Not on file  Food Insecurity:   . Worried About 09/2029 in the Last Year: Not on file  . Ran Out of Food in the Last Year: Not on file  Transportation Needs:   . Lack of Transportation (Medical): Not on file  . Lack of Transportation (Non-Medical): Not on file  Physical Activity:   . Days  of Exercise per Week: Not on file  . Minutes of Exercise per Session: Not on file  Stress:   . Feeling of Stress : Not on file  Social Connections:   . Frequency of Communication with Friends and Family: Not on file  . Frequency of Social Gatherings with Friends and Family: Not on file  . Attends Religious Services: Not on file  . Active Member of Clubs or Organizations: Not on file  . Attends 2010 Meetings: Not on file  . Marital Status: Not on file  Intimate Partner Violence:   . Fear of Current or Ex-Partner: Not on file  . Emotionally Abused: Not on file  . Physically Abused: Not on file  . Sexually Abused: Not on file    Family History  Problem Relation Age of Onset  . Cancer Mother   . Breast cancer Mother 26  . Cancer Brother   . Pancreatic cancer Brother   . Colon cancer Neg Hx   . Esophageal cancer Neg Hx   . Stomach cancer Neg Hx   . Rectal cancer Neg Hx      Immunization History  Administered Date(s) Administered  . Influenza,inj,Quad PF,6+ Mos 02/07/2019  . Influenza-Unspecified  05/05/2020  . Moderna SARS-COVID-2 Vaccination 06/22/2019, 07/18/2019  . PFIZER SARS-COV-2 Vaccination 04/16/2020    Outpatient Encounter Medications as of 05/23/2020  Medication Sig  . FLUoxetine (PROZAC) 20 MG capsule Take 1 capsule (20 mg total) by mouth daily.  . naproxen (NAPROSYN) 500 MG tablet Take 1 tablet (500 mg total) by mouth 2 (two) times daily with a meal.  . venlafaxine XR (EFFEXOR-XR) 75 MG 24 hr capsule Take 1 capsule (75 mg total) by mouth daily with breakfast.  . zolpidem (AMBIEN) 10 MG tablet TAKE 1 TABLET (10 MG TOTAL) BY MOUTH AT BEDTIME AS NEEDED. FOR SLEEP   Facility-Administered Encounter Medications as of 05/23/2020  Medication  . 0.9 %  sodium chloride infusion     ROS: Gen: no fever, chills  Skin: no rash, itching ENT: no ear pain, ear drainage, nasal congestion, rhinorrhea, sinus pressure, sore throat Eyes: no blurry vision, double  vision Resp: no cough, wheeze,SOB Breast: no breast tenderness, no nipple discharge, no breast masses CV: no CP, palpitations, LE edema,  GI: no heartburn, n/v/d/c, abd pain GU: no dysuria, urgency, frequency, hematuria; vaginal dryness MSK: no joint pain, myalgias, back pain Neuro: no dizziness, headache, weakness, vertigo Psych: no depression, anxiety, insomnia   No Known Allergies  BP 116/70   Pulse 86   Temp 97.8 F (36.6 C) (Temporal)   Ht 5\' 7"  (1.702 m)   Wt 165 lb (74.8 kg)   SpO2 97%   BMI 25.84 kg/m    Wt Readings from Last 3 Encounters:  05/23/20 165 lb (74.8 kg)  02/08/20 164 lb 3.2 oz (74.5 kg)  08/26/19 160 lb (72.6 kg)     Physical Exam Constitutional:      General: She is not in acute distress.    Appearance: She is well-developed.  HENT:     Head: Normocephalic and atraumatic.     Right Ear: Tympanic membrane and ear canal normal.     Left Ear: Tympanic membrane and ear canal normal.     Nose: Nose normal.  Eyes:     Conjunctiva/sclera: Conjunctivae normal.     Pupils: Pupils are equal, round, and reactive to light.  Neck:     Thyroid: No thyromegaly.  Cardiovascular:     Rate and Rhythm: Normal rate and regular rhythm.     Heart sounds: Normal heart sounds. No murmur heard.   Pulmonary:     Effort: Pulmonary effort is normal. No respiratory distress.     Breath sounds: Normal breath sounds. No wheezing or rhonchi.  Abdominal:     General: Bowel sounds are normal. There is no distension.     Palpations: Abdomen is soft. There is no mass.     Tenderness: There is no abdominal tenderness.  Musculoskeletal:     Cervical back: Neck supple.     Right lower leg: No edema.     Left lower leg: No edema.  Lymphadenopathy:     Cervical: No cervical adenopathy.  Skin:    General: Skin is warm and dry.  Neurological:     Mental Status: She is alert and oriented to person, place, and time.     Motor: No abnormal muscle tone.     Coordination:  Coordination normal.  Psychiatric:        Behavior: Behavior normal.      A/P:  1. Annual physical exam - discussed importance of regular CV exercise, healthy diet, adequate sleep - UTD on dental exam, vision appt scheduled - UTD  on mammo, PAP, colonoscopy  - immunizations UTD - ALT - AST - Basic metabolic panel - CBC - Lipid panel - next CPE in1 year  2. Anxiety - overall well-controlled, stable - will up effexor to 150mg  for few months then decrease in spring back to 75mg  daily - FLUoxetine (PROZAC) 20 MG capsule; Take 1 capsule (20 mg total) by mouth daily.  Dispense: 90 capsule; Refill: 3 - venlafaxine XR (EFFEXOR-XR) 150 MG 24 hr capsule; Take 1 capsule (150 mg total) by mouth daily with breakfast.  Dispense: 90 capsule; Refill: 3  3. Insomnia, unspecified type - controlled on ambien 10mg  - UDS UTD  4. Encounter for vitamin deficiency screening - VITAMIN D 25 Hydroxy (Vit-D Deficiency, Fractures)   This visit occurred during the SARS-CoV-2 public health emergency.  Safety protocols were in place, including screening questions prior to the visit, additional usage of staff PPE, and extensive cleaning of exam room while observing appropriate contact time as indicated for disinfecting solutions.

## 2020-08-06 ENCOUNTER — Other Ambulatory Visit: Payer: Self-pay | Admitting: Family Medicine

## 2020-08-06 DIAGNOSIS — G4700 Insomnia, unspecified: Secondary | ICD-10-CM

## 2020-08-16 ENCOUNTER — Other Ambulatory Visit: Payer: Self-pay | Admitting: Family Medicine

## 2020-08-16 DIAGNOSIS — Z1231 Encounter for screening mammogram for malignant neoplasm of breast: Secondary | ICD-10-CM

## 2020-09-26 ENCOUNTER — Other Ambulatory Visit: Payer: Self-pay

## 2020-09-26 ENCOUNTER — Ambulatory Visit
Admission: RE | Admit: 2020-09-26 | Discharge: 2020-09-26 | Disposition: A | Discharge status: Home / Self Care | Payer: Managed Care, Other (non HMO) | Source: Ambulatory Visit

## 2020-09-26 DIAGNOSIS — Z1231 Encounter for screening mammogram for malignant neoplasm of breast: Secondary | ICD-10-CM

## 2020-10-22 ENCOUNTER — Other Ambulatory Visit: Payer: Self-pay | Admitting: Family Medicine

## 2020-10-29 ENCOUNTER — Other Ambulatory Visit: Payer: Self-pay | Admitting: Family Medicine

## 2020-10-29 DIAGNOSIS — G4700 Insomnia, unspecified: Secondary | ICD-10-CM

## 2020-10-30 NOTE — Telephone Encounter (Signed)
Refill request for: Ambien 10 mg LR 08/07/20, #90, 0 rf LOV 05/23/20 FOV none scheduled.   Please review and advise.  Thanks. Dm/cma

## 2020-10-31 NOTE — Telephone Encounter (Signed)
Last OV 05/23/20 Last fill 08/07/20 #90/1

## 2020-11-02 NOTE — Telephone Encounter (Signed)
Made in error

## 2020-12-20 ENCOUNTER — Other Ambulatory Visit: Payer: Self-pay | Admitting: Family Medicine

## 2020-12-20 DIAGNOSIS — G4700 Insomnia, unspecified: Secondary | ICD-10-CM

## 2021-01-13 ENCOUNTER — Other Ambulatory Visit: Payer: Self-pay | Admitting: Family Medicine

## 2021-04-17 ENCOUNTER — Encounter: Payer: Self-pay | Admitting: Family Medicine

## 2021-04-17 ENCOUNTER — Ambulatory Visit (INDEPENDENT_AMBULATORY_CARE_PROVIDER_SITE_OTHER): Payer: Managed Care, Other (non HMO) | Admitting: Family Medicine

## 2021-04-17 ENCOUNTER — Other Ambulatory Visit: Payer: Self-pay

## 2021-04-17 VITALS — BP 127/79 | HR 80 | Temp 98.0°F | Ht 65.65 in | Wt 166.6 lb

## 2021-04-17 DIAGNOSIS — Z13220 Encounter for screening for lipoid disorders: Secondary | ICD-10-CM

## 2021-04-17 DIAGNOSIS — Z0000 Encounter for general adult medical examination without abnormal findings: Secondary | ICD-10-CM | POA: Diagnosis not present

## 2021-04-17 DIAGNOSIS — N951 Menopausal and female climacteric states: Secondary | ICD-10-CM

## 2021-04-17 DIAGNOSIS — F419 Anxiety disorder, unspecified: Secondary | ICD-10-CM

## 2021-04-17 DIAGNOSIS — N946 Dysmenorrhea, unspecified: Secondary | ICD-10-CM

## 2021-04-17 DIAGNOSIS — G4700 Insomnia, unspecified: Secondary | ICD-10-CM

## 2021-04-17 DIAGNOSIS — F5101 Primary insomnia: Secondary | ICD-10-CM

## 2021-04-17 MED ORDER — VENLAFAXINE HCL ER 75 MG PO CP24: 75.0000 mg | ORAL_CAPSULE | Freq: Every day | ORAL | 3 refills | Status: DC

## 2021-04-17 MED ORDER — ZOLPIDEM TARTRATE 10 MG PO TABS: 10.0000 mg | ORAL_TABLET | Freq: Every evening | ORAL | 1 refills | Status: DC | PRN

## 2021-04-17 MED ORDER — FLUOXETINE HCL 20 MG PO CAPS: 20.0000 mg | ORAL_CAPSULE | Freq: Every day | ORAL | 3 refills | Status: DC

## 2021-04-17 NOTE — Patient Instructions (Signed)
Preventive Care 40-56 Years Old, Female Preventive care refers to lifestyle choices and visits with your health care provider that can promote health and wellness. This includes: A yearly physical exam. This is also called an annual wellness visit. Regular dental and eye exams. Immunizations. Screening for certain conditions. Healthy lifestyle choices, such as: Eating a healthy diet. Getting regular exercise. Not using drugs or products that contain nicotine and tobacco. Limiting alcohol use. What can I expect for my preventive care visit? Physical exam Your health care provider will check your: Height and weight. These may be used to calculate your BMI (body mass index). BMI is a measurement that tells if you are at a healthy weight. Heart rate and blood pressure. Body temperature. Skin for abnormal spots. Counseling Your health care provider may ask you questions about your: Past medical problems. Family's medical history. Alcohol, tobacco, and drug use. Emotional well-being. Home life and relationship well-being. Sexual activity. Diet, exercise, and sleep habits. Work and work environment. Access to firearms. Method of birth control. Menstrual cycle. Pregnancy history. What immunizations do I need? Vaccines are usually given at various ages, according to a schedule. Your health care provider will recommend vaccines for you based on your age, medical history, and lifestyle or other factors, such as travel or where you work. What tests do I need? Blood tests Lipid and cholesterol levels. These may be checked every 5 years, or more often if you are over 50 years old. Hepatitis C test. Hepatitis B test. Screening Lung cancer screening. You may have this screening every year starting at age 55 if you have a 30-pack-year history of smoking and currently smoke or have quit within the past 15 years. Colorectal cancer screening. All adults should have this screening starting at  age 50 and continuing until age 75. Your health care provider may recommend screening at age 45 if you are at increased risk. You will have tests every 1-10 years, depending on your results and the type of screening test. Diabetes screening. This is done by checking your blood sugar (glucose) after you have not eaten for a while (fasting). You may have this done every 1-3 years. Mammogram. This may be done every 1-2 years. Talk with your health care provider about when you should start having regular mammograms. This may depend on whether you have a family history of breast cancer. BRCA-related cancer screening. This may be done if you have a family history of breast, ovarian, tubal, or peritoneal cancers. Pelvic exam and Pap test. This may be done every 3 years starting at age 21. Starting at age 30, this may be done every 5 years if you have a Pap test in combination with an HPV test. Other tests STD (sexually transmitted disease) testing, if you are at risk. Bone density scan. This is done to screen for osteoporosis. You may have this scan if you are at high risk for osteoporosis. Talk with your health care provider about your test results, treatment options, and if necessary, the need for more tests. Follow these instructions at home: Eating and drinking  Eat a diet that includes fresh fruits and vegetables, whole grains, lean protein, and low-fat dairy products. Take vitamin and mineral supplements as recommended by your health care provider. Do not drink alcohol if: Your health care provider tells you not to drink. You are pregnant, may be pregnant, or are planning to become pregnant. If you drink alcohol: Limit how much you have to 0-1 drink a day. Be   aware of how much alcohol is in your drink. In the [place] one drink equals one 12 oz bottle of beer (355 mL), one 5 oz glass of wine (148 mL), or one 1 oz glass of hard liquor (44 mL). Lifestyle Take daily care of your teeth and  gums. Brush your teeth every morning and night with fluoride toothpaste. Floss one time each day. Stay active. Exercise for at least 30 minutes 5 or more days each week. Do not use any products that contain nicotine or tobacco, such as cigarettes, e-cigarettes, and chewing tobacco. If you need help quitting, ask your health care provider. Do not use drugs. If you are sexually active, practice safe sex. Use a condom or other form of protection to prevent STIs (sexually transmitted infections). If you do not wish to become pregnant, use a form of birth control. If you plan to become pregnant, see your health care provider for a prepregnancy visit. If told by your health care provider, take low-dose aspirin daily starting at age 63. Find healthy ways to cope with stress, such as: Meditation, yoga, or listening to music. Journaling. Talking to a trusted person. Spending time with friends and family. Safety Always wear your seat belt while driving or riding in a vehicle. Do not drive: If you have been drinking alcohol. Do not ride with someone who has been drinking. When you are tired or distracted. While texting. Wear a helmet and other protective equipment during sports activities. If you have firearms in your house, make sure you follow all gun safety procedures. What's next? Visit your health care provider once a year for an annual wellness visit. Ask your health care provider how often you should have your eyes and teeth checked. Stay up to date on all vaccines. This information is not intended to replace advice given to you by your health care provider. Make sure you discuss any questions you have with your health care provider. Document Revised: 08/10/2020 Document Reviewed: 02/11/2018 Elsevier Patient Education  2022 [employer].

## 2021-04-18 ENCOUNTER — Encounter: Payer: Self-pay | Admitting: Family Medicine

## 2021-04-18 DIAGNOSIS — G4700 Insomnia, unspecified: Secondary | ICD-10-CM | POA: Insufficient documentation

## 2021-04-18 DIAGNOSIS — Z0000 Encounter for general adult medical examination without abnormal findings: Secondary | ICD-10-CM | POA: Insufficient documentation

## 2021-04-18 LAB — COMPLETE METABOLIC PANEL WITH GFR
AG Ratio: 1.9 (calc) (ref 1.0–2.5)
ALT: 22 U/L (ref 6–29)
AST: 23 U/L (ref 10–35)
Albumin: 4.6 g/dL (ref 3.6–5.1)
Alkaline phosphatase (APISO): 46 U/L (ref 37–153)
BUN: 11 mg/dL (ref 7–25)
CO2: 29 mmol/L (ref 20–32)
Calcium: 10 mg/dL (ref 8.6–10.4)
Chloride: 101 mmol/L (ref 98–110)
Creat: 0.84 mg/dL (ref 0.50–1.03)
Globulin: 2.4 g/dL (calc) (ref 1.9–3.7)
Glucose, Bld: 82 mg/dL (ref 65–99)
Potassium: 4.3 mmol/L (ref 3.5–5.3)
Sodium: 139 mmol/L (ref 135–146)
Total Bilirubin: 0.4 mg/dL (ref 0.2–1.2)
Total Protein: 7 g/dL (ref 6.1–8.1)
eGFR: 82 mL/min/{1.73_m2} (ref >=60)

## 2021-04-18 LAB — LIPID PANEL W/REFLEX DIRECT LDL
Cholesterol: 186 mg/dL (ref <200)
HDL: 87 mg/dL (ref >=50)
LDL Cholesterol (Calc): 85 mg/dL (calc)
Non-HDL Cholesterol (Calc): 99 mg/dL (calc) (ref <130)
Total CHOL/HDL Ratio: 2.1 (calc) (ref <5.0)
Triglycerides: 67 mg/dL (ref <150)

## 2021-04-18 LAB — CBC WITH DIFFERENTIAL/PLATELET
Absolute Monocytes: 445 cells/uL (ref 200–950)
Basophils Absolute: 31 cells/uL (ref 0–200)
Basophils Relative: 0.5 %
Eosinophils Absolute: 207 cells/uL (ref 15–500)
Eosinophils Relative: 3.4 %
HCT: 38.4 % (ref 35.0–45.0)
Hemoglobin: 13.1 g/dL (ref 11.7–15.5)
Lymphs Abs: 2300 cells/uL (ref 850–3900)
MCH: 31.7 pg (ref 27.0–33.0)
MCHC: 34.1 g/dL (ref 32.0–36.0)
MCV: 93 fL (ref 80.0–100.0)
MPV: 11.9 fL (ref 7.5–12.5)
Monocytes Relative: 7.3 %
Neutro Abs: 3117 cells/uL (ref 1500–7800)
Neutrophils Relative %: 51.1 %
Platelets: 292 10*3/uL (ref 140–400)
RBC: 4.13 10*6/uL (ref 3.80–5.10)
RDW: 11.7 % (ref 11.0–15.0)
Total Lymphocyte: 37.7 %
WBC: 6.1 10*3/uL (ref 3.8–10.8)

## 2021-04-18 LAB — HEMOGLOBIN A1C
Hgb A1c MFr Bld: 5.4 % of total Hgb (ref <5.7)
Mean Plasma Glucose: 108 mg/dL
eAG (mmol/L): 6 mmol/L

## 2021-04-18 LAB — TSH+FREE T4: TSH W/REFLEX TO FT4: 1.77 mIU/L (ref 0.40–4.50)

## 2021-04-18 LAB — FSH/LH
FSH: 68.9 m[IU]/mL
LH: 36.8 m[IU]/mL

## 2021-04-18 NOTE — Assessment & Plan Note (Signed)
Has Ambien as needed.  Doing well with this.  Prescription renewed.

## 2021-04-18 NOTE — Assessment & Plan Note (Signed)
Well adult Orders Placed This Encounter  Procedures  . COMPLETE METABOLIC PANEL WITH GFR  . CBC with Differential  . Lipid Panel w/reflex Direct LDL  . TSH + free T4  . FSH/LH  . HgB A1c  Screening:, TSH, A1c and lipid panel Immunizations: Up-to-date Anticipatory guidance/risk factor reduction: Recommendations per AVS.

## 2021-04-18 NOTE — Assessment & Plan Note (Signed)
She continues to do well with combination of fluoxetine and Effexor.  No side effects noted with medication.  Continue current strength.

## 2021-04-18 NOTE — Progress Notes (Signed)
Melissa Warner - 55 y.o. female MRN [id number]  Date of birth: 1966-02-15  Subjective Chief Complaint  Patient presents with   Establish Care    HPI Melissa Warner is a 55 year old female here today for initial visit to establish care.  Overall she has been in good health.  She does have history of anxiety which has been well controlled with fluoxetine and Effexor.  She does use Ambien for insomnia as well.  His medications are working well for her.  She is up-to-date on Pap and mammogram.  She has had colonoscopy which is up-to-date as well.  She continues to have menstrual cycles however think she may be approaching menopause.  She does exercise regularly and tries to follow a fairly healthy diet.  She is a non-smoker.  She does consume alcohol occasionally.  Review of Systems  Constitutional:  Negative for chills, fever, malaise/fatigue and weight loss.  HENT:  Negative for congestion, ear pain and sore throat.   Eyes:  Negative for blurred vision, double vision and pain.  Respiratory:  Negative for cough and shortness of breath.   Cardiovascular:  Negative for chest pain and palpitations.  Gastrointestinal:  Negative for abdominal pain, blood in stool, constipation, heartburn and nausea.  Genitourinary:  Negative for dysuria and urgency.  Musculoskeletal:  Negative for joint pain and myalgias.  Neurological:  Negative for dizziness and headaches.  Endo/Heme/Allergies:  Does not bruise/bleed easily.  Psychiatric/Behavioral:  Negative for depression. The patient is not nervous/anxious and does not have insomnia.    No Known Allergies  Past Medical History:  Diagnosis Date   Depression    Lyme disease     Past Surgical History:  Procedure Laterality Date   BREAST EXCISIONAL BIOPSY Right 2009   benign   CESAREAN SECTION      Social History   Socioeconomic History   Marital status: Married    Spouse name: Raniya Golembeski   Number of children: Not on file   Years of education: Not on  file   Highest education level: Not on file  Occupational History   Not on file  Tobacco Use   Smoking status: Never    Passive exposure: Never   Smokeless tobacco: Never  Vaping Use   Vaping Use: Never used  Substance and Sexual Activity   Alcohol use: Yes    Alcohol/week: 7.0 standard drinks    Types: 7 Glasses of wine per week   Drug use: Never   Sexual activity: Yes    Partners: Male  Other Topics Concern   Not on file  Social History Narrative   Not on file   Social Determinants of Health   Financial Resource Strain: Not on file  Food Insecurity: Not on file  Transportation Needs: Not on file  Physical Activity: Not on file  Stress: Not on file  Social Connections: Not on file    Family History  Problem Relation Age of Onset   Cancer Mother    Breast cancer Mother 16   Cancer Brother    Pancreatic cancer Brother    Colon cancer Neg Hx    Esophageal cancer Neg Hx    Stomach cancer Neg Hx    Rectal cancer Neg Hx     Health Maintenance  Topic Date Due   HIV Screening  Never done   Hepatitis C Screening  Never done   COVID-19 Vaccine (4 - Booster) 06/11/2020   Pneumococcal Vaccine 47-67 Years old (1 - PCV) 04/17/2022 (Originally 08/19/1971)  PAP SMEAR-Modifier  04/27/2022   MAMMOGRAM  09/27/2022   TETANUS/TDAP  06/16/2029   COLONOSCOPY (Pts 45-38yrs Insurance coverage will need to be confirmed)  08/25/2029   INFLUENZA VACCINE  Completed   Zoster Vaccines- Shingrix  Completed   HPV VACCINES  Aged Out     ----------------------------------------------------------------------------------------------------------------------------------------------------------------------------------------------------------------- Physical Exam BP 127/79 (BP Location: Left Arm, Patient Position: Sitting, Cuff Size: Normal)   Pulse 80   Temp 98 F (36.7 C)   Ht 5' 5.65" (1.668 m)   Wt 166 lb 9.6 oz (75.6 kg)   LMP 08/21/2019 (Approximate)   SpO2 100%   BMI 27.18 kg/m    Physical Exam Constitutional:      General: She is not in acute distress. HENT:     Head: Normocephalic and atraumatic.     Right Ear: Tympanic membrane and ear canal normal.     Left Ear: Tympanic membrane and ear canal normal.     Nose: Nose normal.  Eyes:     General: No scleral icterus.    Conjunctiva/sclera: Conjunctivae normal.  Neck:     Thyroid: No thyromegaly.  Cardiovascular:     Rate and Rhythm: Normal rate and regular rhythm.     Heart sounds: Normal heart sounds.  Pulmonary:     Effort: Pulmonary effort is normal.     Breath sounds: Normal breath sounds.  Abdominal:     General: Bowel sounds are normal. There is no distension.     Palpations: Abdomen is soft.     Tenderness: There is no abdominal tenderness. There is no guarding.  Musculoskeletal:        General: Normal range of motion.     Cervical back: Normal range of motion and neck supple.  Lymphadenopathy:     Cervical: No cervical adenopathy.  Skin:    General: Skin is warm and dry.     Findings: No rash.  Neurological:     General: No focal deficit present.     Mental Status: She is alert and oriented to person, place, and time.     Cranial Nerves: No cranial nerve deficit.     Coordination: Coordination normal.  Psychiatric:        Mood and Affect: Mood normal.        Behavior: Behavior normal.    ------------------------------------------------------------------------------------------------------------------------------------------------------------------------------------------------------------------- Assessment and Plan  No problem-specific Assessment & Plan notes found for this encounter.   Meds ordered this encounter  Medications   FLUoxetine (PROZAC) 20 MG capsule    Sig: Take 1 capsule (20 mg total) by mouth daily.    Dispense:  90 capsule    Refill:  3   venlafaxine XR (EFFEXOR-XR) 75 MG 24 hr capsule    Sig: Take 1 capsule (75 mg total) by mouth daily with breakfast.     Dispense:  90 capsule    Refill:  3   zolpidem (AMBIEN) 10 MG tablet    Sig: Take 1 tablet (10 mg total) by mouth at bedtime as needed. for sleep    Dispense:  90 tablet    Refill:  1    Needs appt    No follow-ups on file.    This visit occurred during the SARS-CoV-2 public health emergency.  Safety protocols were in place, including screening questions prior to the visit, additional usage of staff PPE, and extensive cleaning of exam room while observing appropriate contact time as indicated for disinfecting solutions.

## 2021-04-22 MED ORDER — SEMAGLUTIDE-WEIGHT MANAGEMENT 0.5 MG/0.5ML ~~LOC~~ SOAJ: 0.5000 mg | SUBCUTANEOUS | 0 refills | Status: AC

## 2021-04-22 MED ORDER — SEMAGLUTIDE-WEIGHT MANAGEMENT 1.7 MG/0.75ML ~~LOC~~ SOAJ: 1.7000 mg | SUBCUTANEOUS | 0 refills | Status: AC

## 2021-04-22 MED ORDER — SEMAGLUTIDE-WEIGHT MANAGEMENT 0.25 MG/0.5ML ~~LOC~~ SOAJ: 0.2500 mg | SUBCUTANEOUS | 0 refills | Status: AC

## 2021-04-22 MED ORDER — SEMAGLUTIDE-WEIGHT MANAGEMENT 1 MG/0.5ML ~~LOC~~ SOAJ: 1.0000 mg | SUBCUTANEOUS | 0 refills | Status: AC

## 2021-04-22 MED ORDER — SEMAGLUTIDE-WEIGHT MANAGEMENT 2.4 MG/0.75ML ~~LOC~~ SOAJ: 2.4000 mg | SUBCUTANEOUS | 2 refills | Status: AC

## 2021-04-26 ENCOUNTER — Telehealth: Payer: Self-pay

## 2021-04-26 NOTE — Telephone Encounter (Signed)
Medication: Semaglutide-Weight Management (all strengths)  Prior authorization submitted via CoverMyMeds on 04/26/2021 PA submission pending

## 2021-05-03 ENCOUNTER — Ambulatory Visit: Payer: Managed Care, Other (non HMO) | Admitting: Family Medicine

## 2021-05-03 NOTE — Telephone Encounter (Signed)
Medication: Semaglutide-Weight Management (all strengths)  Prior authorization determination received Medication has been denied Appeal filed 05/03/2021

## 2021-05-15 ENCOUNTER — Telehealth: Payer: Self-pay

## 2021-05-15 NOTE — Telephone Encounter (Signed)
Medication: Semaglutide-Weight Management 2.4 MG/0.75ML SOAJ Prior authorization submitted via CoverMyMeds on 05/15/2021 PA submission pending

## 2021-06-04 NOTE — Telephone Encounter (Signed)
Medication:  Semaglutide-Weight Management 2.4 MG/0.75ML SOAJ Prior authorization determination received Medication has been denied Reason for denial:  "Your plan covers this drug when you meet one of these conditions: - You have at least one weight-related comorbid condition (e.g., hypertension, type 2 diabetes mellitus or dyslipidemia)"

## 2021-07-26 ENCOUNTER — Other Ambulatory Visit: Payer: Self-pay | Admitting: Family Medicine

## 2021-07-26 ENCOUNTER — Other Ambulatory Visit: Payer: Self-pay

## 2021-07-26 DIAGNOSIS — G4700 Insomnia, unspecified: Secondary | ICD-10-CM

## 2021-07-26 MED ORDER — ZOLPIDEM TARTRATE 10 MG PO TABS: 10.0000 mg | ORAL_TABLET | Freq: Every evening | ORAL | 0 refills | Status: DC | PRN

## 2021-10-01 ENCOUNTER — Other Ambulatory Visit: Payer: Self-pay | Admitting: Family Medicine

## 2021-10-01 DIAGNOSIS — Z1231 Encounter for screening mammogram for malignant neoplasm of breast: Secondary | ICD-10-CM

## 2021-10-03 ENCOUNTER — Ambulatory Visit
Admission: RE | Admit: 2021-10-03 | Discharge: 2021-10-03 | Disposition: A | Discharge status: Home / Self Care | Payer: Managed Care, Other (non HMO) | Source: Ambulatory Visit

## 2021-10-03 DIAGNOSIS — Z1231 Encounter for screening mammogram for malignant neoplasm of breast: Secondary | ICD-10-CM

## 2021-10-16 LAB — HM PAP SMEAR: HM Pap smear: NEGATIVE

## 2021-11-18 ENCOUNTER — Other Ambulatory Visit: Payer: Self-pay

## 2021-11-18 DIAGNOSIS — G4700 Insomnia, unspecified: Secondary | ICD-10-CM

## 2021-11-18 MED ORDER — ZOLPIDEM TARTRATE 10 MG PO TABS: 10.0000 mg | ORAL_TABLET | Freq: Every evening | ORAL | 0 refills | Status: DC | PRN

## 2021-11-18 NOTE — Progress Notes (Signed)
Completed.

## 2022-02-10 ENCOUNTER — Other Ambulatory Visit: Payer: Self-pay | Admitting: Family Medicine

## 2022-02-10 DIAGNOSIS — F419 Anxiety disorder, unspecified: Secondary | ICD-10-CM

## 2022-02-11 NOTE — Telephone Encounter (Signed)
Pls contact pt for appt. Unable to provide refills. Thanks

## 2022-02-12 NOTE — Telephone Encounter (Signed)
V.Mail left for patient to return call and schedule a f/u regarding further med refills!

## 2022-03-05 ENCOUNTER — Other Ambulatory Visit: Payer: Self-pay | Admitting: Family Medicine

## 2022-03-05 DIAGNOSIS — G4700 Insomnia, unspecified: Secondary | ICD-10-CM

## 2022-04-09 ENCOUNTER — Encounter: Payer: Self-pay | Admitting: Family Medicine

## 2022-04-09 ENCOUNTER — Ambulatory Visit (INDEPENDENT_AMBULATORY_CARE_PROVIDER_SITE_OTHER): Payer: Managed Care, Other (non HMO) | Admitting: Family Medicine

## 2022-04-09 VITALS — BP 124/70 | HR 76 | Ht 65.65 in | Wt 171.0 lb

## 2022-04-09 DIAGNOSIS — R232 Flushing: Secondary | ICD-10-CM | POA: Diagnosis not present

## 2022-04-09 DIAGNOSIS — R5383 Other fatigue: Secondary | ICD-10-CM | POA: Diagnosis not present

## 2022-04-09 DIAGNOSIS — Z13220 Encounter for screening for lipoid disorders: Secondary | ICD-10-CM | POA: Diagnosis not present

## 2022-04-09 DIAGNOSIS — Z0000 Encounter for general adult medical examination without abnormal findings: Secondary | ICD-10-CM | POA: Diagnosis not present

## 2022-04-09 MED ORDER — VEOZAH 45 MG PO TABS: 45.0000 mg | ORAL_TABLET | Freq: Every day | ORAL | 3 refills | Status: DC

## 2022-04-09 NOTE — Assessment & Plan Note (Signed)
Well adult Orders Placed This Encounter  Procedures  . COMPLETE METABOLIC PANEL WITH GFR  . CBC with Differential  . Lipid Panel w/reflex Direct LDL  . TSH  . Vitamin D (25 hydroxy)  . T4, free  . T3, free  . B12  Screenings: Per lab orders Immunizations: She will get flu vaccine at work. Anticipatory guidance/risk factor reduction: Recommendations per AVS.

## 2022-04-09 NOTE — Assessment & Plan Note (Signed)
She is interested in trying [employer].  Starting 45 mg daily.  Checking baseline LFTs today with recheck in 4 weeks.

## 2022-04-09 NOTE — Progress Notes (Signed)
Melissa Warner - 56 y.o. female MRN [id number]  Date of birth: Mar 30, 1966  Subjective Chief Complaint  Patient presents with   Annual Exam    HPI Melissa Warner is a 56 y.o. female here today for annual exam.    She reports that she is doing well.  Continues to do well with current medications for anxiety and insomnia.  She is having some hot flashes.  Interested in trying nonhormonal treatment for management of these.  She has seen information about Veozah and would like to try this.  She does exercise occasionally.  She reports her diet is good.  Follows healthy diet, rich in daily vegetable intake.   She is a non-smoker.  Occasional EtOH use.   She had pap completed earlier this year at [facility] in [place].   Review of Systems  Constitutional:  Negative for chills, fever, malaise/fatigue and weight loss.  HENT:  Negative for congestion, ear pain and sore throat.   Eyes:  Negative for blurred vision, double vision and pain.  Respiratory:  Negative for cough and shortness of breath.   Cardiovascular:  Negative for chest pain and palpitations.  Gastrointestinal:  Negative for abdominal pain, blood in stool, constipation, heartburn and nausea.  Genitourinary:  Negative for dysuria and urgency.  Musculoskeletal:  Negative for joint pain and myalgias.  Neurological:  Negative for dizziness and headaches.  Endo/Heme/Allergies:  Does not bruise/bleed easily.  Psychiatric/Behavioral:  Negative for depression. The patient is not nervous/anxious and does not have insomnia.       No Known Allergies  Past Medical History:  Diagnosis Date   Depression    Lyme disease     Past Surgical History:  Procedure Laterality Date   BREAST EXCISIONAL BIOPSY Right 2009   benign   CESAREAN SECTION      Social History   Socioeconomic History   Marital status: Married    Spouse name: Necia Kamm   Number of children: Not on file   Years of education: Not on file   Highest education  level: Not on file  Occupational History   Not on file  Tobacco Use   Smoking status: Never    Passive exposure: Never   Smokeless tobacco: Never  Vaping Use   Vaping Use: Never used  Substance and Sexual Activity   Alcohol use: Yes    Alcohol/week: 7.0 standard drinks of alcohol    Types: 7 Glasses of wine per week   Drug use: Never   Sexual activity: Yes    Partners: Male  Other Topics Concern   Not on file  Social History Narrative   Not on file   Social Determinants of Health   Financial Resource Strain: Not on file  Food Insecurity: Not on file  Transportation Needs: Not on file  Physical Activity: Not on file  Stress: Not on file  Social Connections: Not on file    Family History  Problem Relation Age of Onset   Cancer Mother    Breast cancer Mother 53   Cancer Brother    Pancreatic cancer Brother    Colon cancer Neg Hx    Esophageal cancer Neg Hx    Stomach cancer Neg Hx    Rectal cancer Neg Hx     Health Maintenance  Topic Date Due   PAP SMEAR-Modifier  04/27/2022   COVID-19 Vaccine (4 - Mixed Product risk series) 07/17/2022 (Originally 06/11/2020)   INFLUENZA VACCINE  09/14/2022 (Originally 01/14/2022)   Hepatitis C Screening  04/10/2023 (Originally 08/19/1982)   HIV Screening  04/10/2023 (Originally 08/19/1979)   MAMMOGRAM  10/04/2023   TETANUS/TDAP  06/16/2029   COLONOSCOPY (Pts 45-8yrs Insurance coverage will need to be confirmed)  08/25/2029   Zoster Vaccines- Shingrix  Completed   HPV VACCINES  Aged Out     ----------------------------------------------------------------------------------------------------------------------------------------------------------------------------------------------------------------- Physical Exam BP 124/70 (BP Location: Left Arm, Patient Position: Sitting, Cuff Size: Normal)   Pulse 76   Ht 5' 5.65" (1.668 m)   Wt 171 lb (77.6 kg)   SpO2 98%   BMI 27.90 kg/m   Physical Exam Constitutional:      General: She  is not in acute distress. HENT:     Head: Normocephalic and atraumatic.     Right Ear: Tympanic membrane and ear canal normal.     Left Ear: Tympanic membrane and ear canal normal.     Nose: Nose normal.  Eyes:     General: No scleral icterus.    Conjunctiva/sclera: Conjunctivae normal.  Neck:     Thyroid: No thyromegaly.  Cardiovascular:     Rate and Rhythm: Normal rate and regular rhythm.     Heart sounds: Normal heart sounds.  Pulmonary:     Effort: Pulmonary effort is normal.     Breath sounds: Normal breath sounds.  Abdominal:     General: Bowel sounds are normal. There is no distension.     Palpations: Abdomen is soft.     Tenderness: There is no abdominal tenderness. There is no guarding.  Musculoskeletal:        General: Normal range of motion.     Cervical back: Normal range of motion and neck supple.  Lymphadenopathy:     Cervical: No cervical adenopathy.  Skin:    General: Skin is warm and dry.     Findings: No rash.  Neurological:     General: No focal deficit present.     Mental Status: She is alert and oriented to person, place, and time.     Cranial Nerves: No cranial nerve deficit.     Coordination: Coordination normal.  Psychiatric:        Mood and Affect: Mood normal.        Behavior: Behavior normal.     ------------------------------------------------------------------------------------------------------------------------------------------------------------------------------------------------------------------- Assessment and Plan  Well adult exam Well adult Orders Placed This Encounter  Procedures   COMPLETE METABOLIC PANEL WITH GFR   CBC with Differential   Lipid Panel w/reflex Direct LDL   TSH   Vitamin D (25 hydroxy)   T4, free   T3, free   B12  Screenings: Per lab orders Immunizations: She will get flu vaccine at work. Anticipatory guidance/risk factor reduction: Recommendations per AVS.  Hot flashes She is interested in trying  Veozah.  Starting 45 mg daily.  Checking baseline LFTs today with recheck in 4 weeks.   Meds ordered this encounter  Medications   Fezolinetant (VEOZAH) 45 MG TABS    Sig: Take 45 mg by mouth daily.    Dispense:  30 tablet    Refill:  3    No follow-ups on file.    This visit occurred during the SARS-CoV-2 public health emergency.  Safety protocols were in place, including screening questions prior to the visit, additional usage of staff PPE, and extensive cleaning of exam room while observing appropriate contact time as indicated for disinfecting solutions.

## 2022-04-09 NOTE — Patient Instructions (Signed)
Preventive Care 57-57 Years Old, Female Preventive care refers to lifestyle choices and visits with your health care provider that can promote health and wellness. Preventive care visits are also called wellness exams. What can I expect for my preventive care visit? Counseling Your health care provider may ask you questions about your: Medical history, including: Past medical problems. Family medical history. Pregnancy history. Current health, including: Menstrual cycle. Method of birth control. Emotional well-being. Home life and relationship well-being. Sexual activity and sexual health. Lifestyle, including: Alcohol, nicotine or tobacco, and drug use. Access to firearms. Diet, exercise, and sleep habits. Work and work environment. Sunscreen use. Safety issues such as seatbelt and bike helmet use. Physical exam Your health care provider will check your: Height and weight. These may be used to calculate your BMI (body mass index). BMI is a measurement that tells if you are at a healthy weight. Waist circumference. This measures the distance around your waistline. This measurement also tells if you are at a healthy weight and may help predict your risk of certain diseases, such as type 2 diabetes and high blood pressure. Heart rate and blood pressure. Body temperature. Skin for abnormal spots. What immunizations do I need?  Vaccines are usually given at various ages, according to a schedule. Your health care provider will recommend vaccines for you based on your age, medical history, and lifestyle or other factors, such as travel or where you work. What tests do I need? Screening Your health care provider may recommend screening tests for certain conditions. This may include: Lipid and cholesterol levels. Diabetes screening. This is done by checking your blood sugar (glucose) after you have not eaten for a while (fasting). Pelvic exam and Pap test. Hepatitis B test. Hepatitis C  test. HIV (human immunodeficiency virus) test. STI (sexually transmitted infection) testing, if you are at risk. Lung cancer screening. Colorectal cancer screening. Mammogram. Talk with your health care provider about when you should start having regular mammograms. This may depend on whether you have a family history of breast cancer. BRCA-related cancer screening. This may be done if you have a family history of breast, ovarian, tubal, or peritoneal cancers. Bone density scan. This is done to screen for osteoporosis. Talk with your health care provider about your test results, treatment options, and if necessary, the need for more tests. Follow these instructions at home: Eating and drinking  Eat a diet that includes fresh fruits and vegetables, whole grains, lean protein, and low-fat dairy products. Take vitamin and mineral supplements as recommended by your health care provider. Do not drink alcohol if: Your health care provider tells you not to drink. You are pregnant, may be pregnant, or are planning to become pregnant. If you drink alcohol: Limit how much you have to 0-1 drink a day. Know how much alcohol is in your drink. In the [place] one drink equals one 12 oz bottle of beer (355 mL), one 5 oz glass of wine (148 mL), or one 1 oz glass of hard liquor (44 mL). Lifestyle Brush your teeth every morning and night with fluoride toothpaste. Floss one time each day. Exercise for at least 30 minutes 5 or more days each week. Do not use any products that contain nicotine or tobacco. These products include cigarettes, chewing tobacco, and vaping devices, such as e-cigarettes. If you need help quitting, ask your health care provider. Do not use drugs. If you are sexually active, practice safe sex. Use a condom or other form of protection to   prevent STIs. If you do not wish to become pregnant, use a form of birth control. If you plan to become pregnant, see your health care provider for a  prepregnancy visit. Take aspirin only as told by your health care provider. Make sure that you understand how much to take and what form to take. Work with your health care provider to find out whether it is safe and beneficial for you to take aspirin daily. Find healthy ways to manage stress, such as: Meditation, yoga, or listening to music. Journaling. Talking to a trusted person. Spending time with friends and family. Minimize exposure to UV radiation to reduce your risk of skin cancer. Safety Always wear your seat belt while driving or riding in a vehicle. Do not drive: If you have been drinking alcohol. Do not ride with someone who has been drinking. When you are tired or distracted. While texting. If you have been using any mind-altering substances or drugs. Wear a helmet and other protective equipment during sports activities. If you have firearms in your house, make sure you follow all gun safety procedures. Seek help if you have been physically or sexually abused. What's next? Visit your health care provider once a year for an annual wellness visit. Ask your health care provider how often you should have your eyes and teeth checked. Stay up to date on all vaccines. This information is not intended to replace advice given to you by your health care provider. Make sure you discuss any questions you have with your health care provider. Document Revised: 11/28/2020 Document Reviewed: 11/28/2020 Elsevier Patient Education  [place].

## 2022-04-11 ENCOUNTER — Other Ambulatory Visit: Payer: Self-pay

## 2022-04-11 LAB — CBC WITH DIFFERENTIAL/PLATELET
Absolute Monocytes: 384 cells/uL (ref 200–950)
Basophils Absolute: 18 cells/uL (ref 0–200)
Basophils Relative: 0.3 %
Eosinophils Absolute: 118 cells/uL (ref 15–500)
Eosinophils Relative: 2 %
HCT: 38.6 % (ref 35.0–45.0)
Hemoglobin: 13 g/dL (ref 11.7–15.5)
Lymphs Abs: 1959 cells/uL (ref 850–3900)
MCH: 30.8 pg (ref 27.0–33.0)
MCHC: 33.7 g/dL (ref 32.0–36.0)
MCV: 91.5 fL (ref 80.0–100.0)
MPV: 11.7 fL (ref 7.5–12.5)
Monocytes Relative: 6.5 %
Neutro Abs: 3422 cells/uL (ref 1500–7800)
Neutrophils Relative %: 58 %
Platelets: 323 10*3/uL (ref 140–400)
RBC: 4.22 10*6/uL (ref 3.80–5.10)
RDW: 12.2 % (ref 11.0–15.0)
Total Lymphocyte: 33.2 %
WBC: 5.9 10*3/uL (ref 3.8–10.8)

## 2022-04-11 LAB — LIPID PANEL W/REFLEX DIRECT LDL
Cholesterol: 169 mg/dL
HDL: 77 mg/dL
LDL Cholesterol (Calc): 74 mg/dL
Non-HDL Cholesterol (Calc): 92 mg/dL
Total CHOL/HDL Ratio: 2.2 (calc)
Triglycerides: 94 mg/dL

## 2022-04-11 LAB — COMPLETE METABOLIC PANEL WITHOUT GFR
AG Ratio: 1.9 (calc) (ref 1.0–2.5)
ALT: 19 U/L (ref 6–29)
AST: 18 U/L (ref 10–35)
Albumin: 4.5 g/dL (ref 3.6–5.1)
Alkaline phosphatase (APISO): 62 U/L (ref 37–153)
BUN: 12 mg/dL (ref 7–25)
CO2: 31 mmol/L (ref 20–32)
Calcium: 9.9 mg/dL (ref 8.6–10.4)
Chloride: 102 mmol/L (ref 98–110)
Creat: 0.76 mg/dL (ref 0.50–1.03)
Globulin: 2.4 g/dL (ref 1.9–3.7)
Glucose, Bld: 91 mg/dL (ref 65–99)
Potassium: 4.4 mmol/L (ref 3.5–5.3)
Sodium: 139 mmol/L (ref 135–146)
Total Bilirubin: 0.5 mg/dL (ref 0.2–1.2)
Total Protein: 6.9 g/dL (ref 6.1–8.1)
eGFR: 91 mL/min/1.73m2

## 2022-04-11 LAB — VITAMIN B12: Vitamin B-12: 835 pg/mL (ref 200–1100)

## 2022-04-11 LAB — T3, FREE: T3, Free: 3.1 pg/mL (ref 2.3–4.2)

## 2022-04-11 LAB — TSH: TSH: 1.93 m[IU]/L (ref 0.40–4.50)

## 2022-04-11 LAB — VITAMIN D 25 HYDROXY (VIT D DEFICIENCY, FRACTURES): Vit D, 25-Hydroxy: 105 ng/mL — ABNORMAL HIGH (ref 30–100)

## 2022-04-11 LAB — T4, FREE: Free T4: 1.1 ng/dL (ref 0.8–1.8)

## 2022-04-11 MED ORDER — NAPROXEN 500 MG PO TABS: 500.0000 mg | ORAL_TABLET | Freq: Two times a day (BID) | ORAL | 0 refills | Status: DC

## 2022-04-21 ENCOUNTER — Encounter: Payer: Self-pay | Admitting: Family Medicine

## 2022-04-21 ENCOUNTER — Other Ambulatory Visit: Payer: Self-pay

## 2022-04-21 DIAGNOSIS — F419 Anxiety disorder, unspecified: Secondary | ICD-10-CM

## 2022-04-21 NOTE — Progress Notes (Signed)
0/08/2021 

## 2022-04-22 MED ORDER — FLUOXETINE HCL 20 MG PO CAPS: 20.0000 mg | ORAL_CAPSULE | Freq: Every day | ORAL | 3 refills | Status: DC

## 2022-04-22 MED ORDER — VENLAFAXINE HCL ER 75 MG PO CP24: 75.0000 mg | ORAL_CAPSULE | Freq: Every day | ORAL | 3 refills | Status: DC

## 2022-04-22 MED ORDER — VEOZAH 45 MG PO TABS: 45.0000 mg | ORAL_TABLET | Freq: Every day | ORAL | 3 refills | Status: DC

## 2022-05-14 ENCOUNTER — Encounter: Payer: Self-pay | Admitting: Family Medicine

## 2022-05-15 MED ORDER — VEOZAH 45 MG PO TABS: 45.0000 mg | ORAL_TABLET | Freq: Every day | ORAL | 3 refills | Status: DC

## 2022-05-22 ENCOUNTER — Encounter: Payer: Self-pay | Admitting: Family Medicine

## 2022-06-05 ENCOUNTER — Ambulatory Visit: Payer: Managed Care, Other (non HMO)

## 2022-06-05 ENCOUNTER — Ambulatory Visit (INDEPENDENT_AMBULATORY_CARE_PROVIDER_SITE_OTHER): Payer: Managed Care, Other (non HMO) | Admitting: Sports Medicine

## 2022-06-05 ENCOUNTER — Ambulatory Visit (INDEPENDENT_AMBULATORY_CARE_PROVIDER_SITE_OTHER): Payer: Managed Care, Other (non HMO)

## 2022-06-05 ENCOUNTER — Telehealth: Payer: Self-pay | Admitting: Sports Medicine

## 2022-06-05 DIAGNOSIS — M1712 Unilateral primary osteoarthritis, left knee: Secondary | ICD-10-CM | POA: Diagnosis not present

## 2022-06-05 DIAGNOSIS — M1812 Unilateral primary osteoarthritis of first carpometacarpal joint, left hand: Secondary | ICD-10-CM

## 2022-06-05 DIAGNOSIS — M79642 Pain in left hand: Secondary | ICD-10-CM | POA: Diagnosis not present

## 2022-06-05 MED ORDER — TRIAMCINOLONE ACETONIDE 40 MG/ML IJ SUSP
40.0000 mg | Freq: Once | INTRAMUSCULAR | Status: AC
  Administered 2022-06-05: 40 mg via INTRAMUSCULAR

## 2022-06-05 NOTE — Telephone Encounter (Signed)
Was comfortable, left knee x-ray confirmed osteoarthritis, failed steroid injections, arthroscopy, greater than 6 weeks of conditioning.

## 2022-06-05 NOTE — Progress Notes (Signed)
    Procedures performed today:    Procedure: Real-time Ultrasound Guided injection of the left thumb basal joint Device: Samsung HS60  Verbal informed consent obtained.  Time-out conducted.  Noted no overlying erythema, induration, or other signs of local infection.  Skin prepped in a sterile fashion.  Local anesthesia: Topical Ethyl chloride.  With sterile technique and under real time ultrasound guidance: Arthritic joint noted, 1/2 cc lidocaine, 1/2 cc kenalog 40 injected easily Completed without difficulty  Advised to call if fevers/chills, erythema, induration, drainage, or persistent bleeding.  Images permanently stored and available for review in PACS.  Impression: Technically successful ultrasound guided injection.  Independent interpretation of notes and tests performed by another provider:   None.  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  Primary osteoarthritis of left knee Also having left knee pain, status post arthroscopic meniscal debridement, osteoarthritis on x-rays at an outside orthopedist, she recently had a steroid injection, unfortunately did not get sufficient relief, we will go and get her approved for viscosupplementation, for insurance coverage purposes she has failed greater than 6 weeks of conservative treatment including NSAIDs, steroid injections, arthroscopy. X-ray confirmed osteoarthritis.    ____________________________________________ Ihor Austin. Benjamin Stain, M.D., ABFM., CAQSM., AME. Primary Care and Sports Medicine [facility]  [facility]  Adjunct Professor of Family Medicine  [place] [employer] [facility] [employer]  Restaurant manager, fast food

## 2022-06-05 NOTE — Assessment & Plan Note (Signed)
Also having left knee pain, status post arthroscopic meniscal debridement, osteoarthritis on x-rays at an outside orthopedist, she recently had a steroid injection, unfortunately did not get sufficient relief, we will go and get her approved for viscosupplementation, for insurance coverage purposes she has failed greater than 6 weeks of conservative treatment including NSAIDs, steroid injections, arthroscopy. X-ray confirmed osteoarthritis.

## 2022-06-05 NOTE — Assessment & Plan Note (Signed)
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

## 2022-06-11 ENCOUNTER — Other Ambulatory Visit: Payer: Self-pay | Admitting: Family Medicine

## 2022-06-11 DIAGNOSIS — G4700 Insomnia, unspecified: Secondary | ICD-10-CM

## 2022-06-11 NOTE — Telephone Encounter (Signed)
PA information submitted via [email or web address] for Orthovisc Paperwork has been printed and given to Dr. T for signatures. Once obtained, information will be faxed to [employer] at [phone]  

## 2022-06-11 NOTE — Telephone Encounter (Signed)
30-day supply sent.  Recommend following up with PCP to address insomnia for further refills.

## 2022-06-18 NOTE — Telephone Encounter (Signed)
Benefits Investigation Details received from [employer] Injection: Euflexxa  Medical: deductible applies since OOP is met patient is covered 100% PA required: No Pharmacy: product not covered over pharmacy plan. May fill through: [facility] Copay/Coinsurance: 0% Product Copay: 0% Administration Coinsurance: 0% Administration Copay: 0% Deductible: $5400 (met: $0) Out of Pocket Max: $7400 (met: $7400)

## 2022-06-20 NOTE — Telephone Encounter (Signed)
Called and left VM for patient to call back and get scheduled for her gel injections and get her injections ordered.

## 2022-06-25 NOTE — Telephone Encounter (Signed)
2nd attempt left Message for patient to call back.

## 2022-06-27 NOTE — Telephone Encounter (Signed)
3rd attempt patient can call back and get scheduled for there Orthovisc.Marland Kitchen

## 2022-06-28 ENCOUNTER — Other Ambulatory Visit: Payer: Self-pay | Admitting: Family Medicine

## 2022-07-14 ENCOUNTER — Other Ambulatory Visit: Payer: Self-pay | Admitting: Medical-Surgical

## 2022-07-14 DIAGNOSIS — G4700 Insomnia, unspecified: Secondary | ICD-10-CM

## 2022-07-15 NOTE — Telephone Encounter (Signed)
Last OV: 04/09/22 Next OV: none Last RF: 06/11/22

## 2022-07-17 ENCOUNTER — Ambulatory Visit: Payer: Managed Care, Other (non HMO) | Admitting: Sports Medicine

## 2022-09-17 ENCOUNTER — Ambulatory Visit (INDEPENDENT_AMBULATORY_CARE_PROVIDER_SITE_OTHER): Payer: Managed Care, Other (non HMO) | Admitting: Sports Medicine

## 2022-09-17 ENCOUNTER — Other Ambulatory Visit (INDEPENDENT_AMBULATORY_CARE_PROVIDER_SITE_OTHER): Payer: Managed Care, Other (non HMO)

## 2022-09-17 ENCOUNTER — Other Ambulatory Visit: Payer: Self-pay | Admitting: Family Medicine

## 2022-09-17 DIAGNOSIS — M1712 Unilateral primary osteoarthritis, left knee: Secondary | ICD-10-CM

## 2022-09-17 NOTE — Assessment & Plan Note (Signed)
Pleasant 57 year old female, she is status post arthroscopic meniscal debridement with osteoarthritis on x-rays, steroid injection did not provide relief, we will start Orthovisc today, return in 1 week for Orthovisc No. 2 of 4 left knee.

## 2022-09-17 NOTE — Progress Notes (Signed)
    Procedures performed today:    Procedure: Real-time Ultrasound Guided injection of the left knee Device: Samsung HS60  Verbal informed consent obtained.  Time-out conducted.  Noted no overlying erythema, induration, or other signs of local infection.  Skin prepped in a sterile fashion.  Local anesthesia: Topical Ethyl chloride.  With sterile technique and under real time ultrasound guidance: Mild effusion noted, 30 mg/2 mL of OrthoVisc (sodium hyaluronate) in a prefilled syringe was injected easily into the knee through a 22-gauge needle. Completed without difficulty  Advised to call if fevers/chills, erythema, induration, drainage, or persistent bleeding.  Images permanently stored and available for review in PACS.  Impression: Technically successful ultrasound guided injection.  Independent interpretation of notes and tests performed by another provider:   None.  Brief History, Exam, Impression, and Recommendations:    Primary osteoarthritis of left knee Pleasant 57 year old female, she is status post arthroscopic meniscal debridement with osteoarthritis on x-rays, steroid injection did not provide relief, we will start Orthovisc today, return in 1 week for Orthovisc No. 2 of 4 left knee.    ____________________________________________ Gwen Her. Dianah Field, M.D., ABFM., CAQSM., AME. Primary Care and Sports Medicine [facility]  Adjunct Professor of [place] [employer] [facility] of Medicine  Risk manager

## 2022-09-24 ENCOUNTER — Other Ambulatory Visit (INDEPENDENT_AMBULATORY_CARE_PROVIDER_SITE_OTHER): Payer: Managed Care, Other (non HMO)

## 2022-09-24 ENCOUNTER — Ambulatory Visit (INDEPENDENT_AMBULATORY_CARE_PROVIDER_SITE_OTHER): Payer: Managed Care, Other (non HMO) | Admitting: Sports Medicine

## 2022-09-24 DIAGNOSIS — M1712 Unilateral primary osteoarthritis, left knee: Secondary | ICD-10-CM

## 2022-09-24 NOTE — Progress Notes (Signed)
    Procedures performed today:    Procedure: Real-time Ultrasound Guided injection of the left knee Device: Samsung HS60  Verbal informed consent obtained.  Time-out conducted.  Noted no overlying erythema, induration, or other signs of local infection.  Skin prepped in a sterile fashion.  Local anesthesia: Topical Ethyl chloride.  With sterile technique and under real time ultrasound guidance: Mild effusion noted, 30 mg/2 mL of OrthoVisc (sodium hyaluronate) in a prefilled syringe was injected easily into the knee through a 22-gauge needle. Completed without difficulty  Advised to call if fevers/chills, erythema, induration, drainage, or persistent bleeding.  Images permanently stored and available for review in PACS.  Impression: Technically successful ultrasound guided injection.  Independent interpretation of notes and tests performed by another provider:   None.  Brief History, Exam, Impression, and Recommendations:    Primary osteoarthritis of left knee Orthovisc No. 2 of 4 left knee, return in 1 week for #3 of 4.    ____________________________________________ Ihor Austin. Benjamin Stain, M.D., ABFM., CAQSM., AME. Primary Care and Sports Medicine [facility]  Adjunct Professor of Family Medicine  [place] [employer] [facility] of Medicine  Restaurant manager, fast food

## 2022-09-24 NOTE — Assessment & Plan Note (Signed)
Orthovisc No. 2 of 4 left knee, return in 1 week for #3 of 4. ?

## 2022-10-01 ENCOUNTER — Ambulatory Visit (INDEPENDENT_AMBULATORY_CARE_PROVIDER_SITE_OTHER): Payer: Managed Care, Other (non HMO) | Admitting: Sports Medicine

## 2022-10-01 ENCOUNTER — Other Ambulatory Visit (INDEPENDENT_AMBULATORY_CARE_PROVIDER_SITE_OTHER): Payer: Managed Care, Other (non HMO)

## 2022-10-01 DIAGNOSIS — M1712 Unilateral primary osteoarthritis, left knee: Secondary | ICD-10-CM

## 2022-10-01 NOTE — Assessment & Plan Note (Signed)
Orthovisc 3 of 4 today, return in 1 week for #4 of 4 left knee

## 2022-10-01 NOTE — Progress Notes (Signed)
    Procedures performed today:    Procedure: Real-time Ultrasound Guided injection of the left knee Device: Samsung HS60  Verbal informed consent obtained.  Time-out conducted.  Noted no overlying erythema, induration, or other signs of local infection.  Skin prepped in a sterile fashion.  Local anesthesia: Topical Ethyl chloride.  With sterile technique and under real time ultrasound guidance: Mild effusion noted, 30 mg/2 mL of OrthoVisc (sodium hyaluronate) in a prefilled syringe was injected easily into the knee through a 22-gauge needle. Completed without difficulty  Advised to call if fevers/chills, erythema, induration, drainage, or persistent bleeding.  Images permanently stored and available for review in PACS.  Impression: Technically successful ultrasound guided injection.  Independent interpretation of notes and tests performed by another provider:   None.  Brief History, Exam, Impression, and Recommendations:    Primary osteoarthritis of left knee Orthovisc 3 of 4 today, return in 1 week for #4 of 4 left knee    ____________________________________________ Ihor Austin. Benjamin Stain, M.D., ABFM., CAQSM., AME. Primary Care and Sports Medicine [facility]  Adjunct Professor of Family Medicine  [place] of [facility] of Medicine  Restaurant manager, fast food

## 2022-10-08 ENCOUNTER — Ambulatory Visit (INDEPENDENT_AMBULATORY_CARE_PROVIDER_SITE_OTHER): Payer: Managed Care, Other (non HMO) | Admitting: Sports Medicine

## 2022-10-08 ENCOUNTER — Other Ambulatory Visit (INDEPENDENT_AMBULATORY_CARE_PROVIDER_SITE_OTHER): Payer: Managed Care, Other (non HMO)

## 2022-10-08 DIAGNOSIS — M1712 Unilateral primary osteoarthritis, left knee: Secondary | ICD-10-CM | POA: Diagnosis not present

## 2022-10-08 NOTE — Progress Notes (Signed)
    Procedures performed today:    Procedure: Real-time Ultrasound Guided injection of the left knee Device: Samsung HS60  Verbal informed consent obtained.  Time-out conducted.  Noted no overlying erythema, induration, or other signs of local infection.  Skin prepped in a sterile fashion.  Local anesthesia: Topical Ethyl chloride.  With sterile technique and under real time ultrasound guidance: Mild effusion noted, 30 mg/2 mL of OrthoVisc (sodium hyaluronate) in a prefilled syringe was injected easily into the knee through a 22-gauge needle. Completed without difficulty  Advised to call if fevers/chills, erythema, induration, drainage, or persistent bleeding.  Images permanently stored and available for review in PACS.  Impression: Technically successful ultrasound guided injection.  Independent interpretation of notes and tests performed by another provider:   None.  Brief History, Exam, Impression, and Recommendations:    Primary osteoarthritis of left knee Orthovisc 4 of 4 left knee, return as needed, she is doing well.    ____________________________________________ Ihor Austin. Benjamin Stain, M.D., ABFM., CAQSM., AME. Primary Care and Sports Medicine [facility]  Adjunct Professor of Family Medicine  [place] [employer] [facility] of Medicine  Restaurant manager, fast food

## 2022-10-08 NOTE — Assessment & Plan Note (Signed)
Orthovisc 4 of 4 left knee, return as needed, she is doing well.

## 2022-10-23 ENCOUNTER — Other Ambulatory Visit: Payer: Self-pay | Admitting: Family Medicine

## 2022-10-23 DIAGNOSIS — Z1231 Encounter for screening mammogram for malignant neoplasm of breast: Secondary | ICD-10-CM

## 2022-11-05 ENCOUNTER — Ambulatory Visit
Admission: RE | Admit: 2022-11-05 | Discharge: 2022-11-05 | Disposition: A | Discharge status: Home / Self Care | Payer: Managed Care, Other (non HMO) | Source: Ambulatory Visit | Attending: Family Medicine | Admitting: Family Medicine

## 2022-11-05 DIAGNOSIS — Z1231 Encounter for screening mammogram for malignant neoplasm of breast: Secondary | ICD-10-CM

## 2022-11-26 ENCOUNTER — Ambulatory Visit (INDEPENDENT_AMBULATORY_CARE_PROVIDER_SITE_OTHER): Payer: Managed Care, Other (non HMO) | Admitting: Sports Medicine

## 2022-11-26 ENCOUNTER — Other Ambulatory Visit (INDEPENDENT_AMBULATORY_CARE_PROVIDER_SITE_OTHER): Payer: Managed Care, Other (non HMO)

## 2022-11-26 DIAGNOSIS — M1812 Unilateral primary osteoarthritis of first carpometacarpal joint, left hand: Secondary | ICD-10-CM

## 2022-11-26 NOTE — Assessment & Plan Note (Addendum)
Very pleasant 57 year old female with known first CMC osteoarthritis, she is a Teacher, early years/pre, we did an injection back in December, she did well until now, repeat left first CMC injection, return to see me as needed. I have advised her to get more consistent with the home physical therapy and get the thumb loop.

## 2022-11-26 NOTE — Progress Notes (Signed)
    Procedures performed today:    Procedure: Real-time Ultrasound Guided injection of the left first [facility] Device: Samsung HS60  Verbal informed consent obtained.  Time-out conducted.  Noted no overlying erythema, induration, or other signs of local infection.  Skin prepped in a sterile fashion.  Local anesthesia: Topical Ethyl chloride.  With sterile technique and under real time ultrasound guidance: Arthritic joint noted, 1/2 cc lidocaine, 1/2 cc kenalog 40 injected easily. Completed without difficulty  Advised to call if fevers/chills, erythema, induration, drainage, or persistent bleeding.  Images permanently stored and available for review in PACS.  Impression: Technically successful ultrasound guided injection.  Independent interpretation of notes and tests performed by another provider:   None.  Brief History, Exam, Impression, and Recommendations:    Primary osteoarthritis of first carpometacarpal joint of left hand Very pleasant 57 year old female with known first [facility] osteoarthritis, she is a Teacher, early years/pre, we did an injection back in December, she did well until now, repeat left first CMC injection, return to see me as needed. I have advised her to get more consistent with the home physical therapy and get the thumb loop.    ____________________________________________ Ihor Austin. Benjamin Stain, M.D., ABFM., CAQSM., AME. Primary Care and Sports Medicine [facility]  Adjunct Professor of Family Medicine  [employer] [facility] [employer] [facility] [employer]  Restaurant manager, fast food

## 2022-12-08 ENCOUNTER — Other Ambulatory Visit: Payer: Self-pay | Admitting: Family Medicine

## 2022-12-08 DIAGNOSIS — G4700 Insomnia, unspecified: Secondary | ICD-10-CM

## 2022-12-09 NOTE — Telephone Encounter (Signed)
Called patient left vm to call back and schedule an appointment for further refill request.

## 2022-12-09 NOTE — Telephone Encounter (Signed)
Pls contact the patient to schedule medication refill appt with Dr. Ashley Royalty. Sending 30 day refill. No additional refills without kept appt. Thanks

## 2022-12-25 ENCOUNTER — Other Ambulatory Visit: Payer: Self-pay | Admitting: Family Medicine

## 2023-01-13 ENCOUNTER — Other Ambulatory Visit: Payer: Self-pay | Admitting: Family Medicine

## 2023-01-13 DIAGNOSIS — G4700 Insomnia, unspecified: Secondary | ICD-10-CM

## 2023-03-11 ENCOUNTER — Other Ambulatory Visit: Payer: Self-pay | Admitting: Family Medicine

## 2023-03-19 ENCOUNTER — Other Ambulatory Visit: Payer: Self-pay | Admitting: Family Medicine

## 2023-03-20 ENCOUNTER — Other Ambulatory Visit: Payer: Self-pay | Admitting: Family Medicine

## 2023-03-20 MED ORDER — VEOZAH 45 MG PO TABS: 45.0000 mg | ORAL_TABLET | Freq: Every day | ORAL | 0 refills | Status: DC

## 2023-03-25 ENCOUNTER — Other Ambulatory Visit: Payer: Self-pay | Admitting: Family Medicine

## 2023-03-25 DIAGNOSIS — F419 Anxiety disorder, unspecified: Secondary | ICD-10-CM

## 2023-03-27 NOTE — Telephone Encounter (Signed)
Hold for upcoming appt

## 2023-04-15 ENCOUNTER — Ambulatory Visit (INDEPENDENT_AMBULATORY_CARE_PROVIDER_SITE_OTHER): Payer: Managed Care, Other (non HMO) | Admitting: Family Medicine

## 2023-04-15 ENCOUNTER — Encounter: Payer: Self-pay | Admitting: Family Medicine

## 2023-04-15 VITALS — BP 122/71 | HR 80 | Ht 65.65 in | Wt 157.0 lb

## 2023-04-15 DIAGNOSIS — Z13220 Encounter for screening for lipoid disorders: Secondary | ICD-10-CM | POA: Diagnosis not present

## 2023-04-15 DIAGNOSIS — Z0000 Encounter for general adult medical examination without abnormal findings: Secondary | ICD-10-CM | POA: Diagnosis not present

## 2023-04-15 DIAGNOSIS — R232 Flushing: Secondary | ICD-10-CM | POA: Diagnosis not present

## 2023-04-15 DIAGNOSIS — Z800 Family history of malignant neoplasm of digestive organs: Secondary | ICD-10-CM

## 2023-04-15 NOTE — Assessment & Plan Note (Signed)
Well adult Orders Placed This Encounter  Procedures   CBC with Differential/Platelet   Lipid Panel With LDL/HDL Ratio   Basic Metabolic Panel (BMET)   Hepatic function panel   CEA   Cancer antigen 19-9  Screenings: Per lab orders Immunizations: She will get flu vaccine at work. Anticipatory guidance/risk factor reduction: Recommendations per AVS.

## 2023-04-15 NOTE — Patient Instructions (Signed)
Preventive Care 40-58 Years Old, Female Preventive care refers to lifestyle choices and visits with your health care provider that can promote health and wellness. Preventive care visits are also called wellness exams. What can I expect for my preventive care visit? Counseling Your health care provider may ask you questions about your: Medical history, including: Past medical problems. Family medical history. Pregnancy history. Current health, including: Menstrual cycle. Method of birth control. Emotional well-being. Home life and relationship well-being. Sexual activity and sexual health. Lifestyle, including: Alcohol, nicotine or tobacco, and drug use. Access to firearms. Diet, exercise, and sleep habits. Work and work environment. Sunscreen use. Safety issues such as seatbelt and bike helmet use. Physical exam Your health care provider will check your: Height and weight. These may be used to calculate your BMI (body mass index). BMI is a measurement that tells if you are at a healthy weight. Waist circumference. This measures the distance around your waistline. This measurement also tells if you are at a healthy weight and may help predict your risk of certain diseases, such as type 2 diabetes and high blood pressure. Heart rate and blood pressure. Body temperature. Skin for abnormal spots. What immunizations do I need?  Vaccines are usually given at various ages, according to a schedule. Your health care provider will recommend vaccines for you based on your age, medical history, and lifestyle or other factors, such as travel or where you work. What tests do I need? Screening Your health care provider may recommend screening tests for certain conditions. This may include: Lipid and cholesterol levels. Diabetes screening. This is done by checking your blood sugar (glucose) after you have not eaten for a while (fasting). Pelvic exam and Pap test. Hepatitis B test. Hepatitis C  test. HIV (human immunodeficiency virus) test. STI (sexually transmitted infection) testing, if you are at risk. Lung cancer screening. Colorectal cancer screening. Mammogram. Talk with your health care provider about when you should start having regular mammograms. This may depend on whether you have a family history of breast cancer. BRCA-related cancer screening. This may be done if you have a family history of breast, ovarian, tubal, or peritoneal cancers. Bone density scan. This is done to screen for osteoporosis. Talk with your health care provider about your test results, treatment options, and if necessary, the need for more tests. Follow these instructions at home: Eating and drinking  Eat a diet that includes fresh fruits and vegetables, whole grains, lean protein, and low-fat dairy products. Take vitamin and mineral supplements as recommended by your health care provider. Do not drink alcohol if: Your health care provider tells you not to drink. You are pregnant, may be pregnant, or are planning to become pregnant. If you drink alcohol: Limit how much you have to 0-1 drink a day. Know how much alcohol is in your drink. In the [place] one drink equals one 12 oz bottle of beer (355 mL), one 5 oz glass of wine (148 mL), or one 1 oz glass of hard liquor (44 mL). Lifestyle Brush your teeth every morning and night with fluoride toothpaste. Floss one time each day. Exercise for at least 30 minutes 5 or more days each week. Do not use any products that contain nicotine or tobacco. These products include cigarettes, chewing tobacco, and vaping devices, such as e-cigarettes. If you need help quitting, ask your health care provider. Do not use drugs. If you are sexually active, practice safe sex. Use a condom or other form of protection to   prevent STIs. If you do not wish to become pregnant, use a form of birth control. If you plan to become pregnant, see your health care provider for a  prepregnancy visit. Take aspirin only as told by your health care provider. Make sure that you understand how much to take and what form to take. Work with your health care provider to find out whether it is safe and beneficial for you to take aspirin daily. Find healthy ways to manage stress, such as: Meditation, yoga, or listening to music. Journaling. Talking to a trusted person. Spending time with friends and family. Minimize exposure to UV radiation to reduce your risk of skin cancer. Safety Always wear your seat belt while driving or riding in a vehicle. Do not drive: If you have been drinking alcohol. Do not ride with someone who has been drinking. When you are tired or distracted. While texting. If you have been using any mind-altering substances or drugs. Wear a helmet and other protective equipment during sports activities. If you have firearms in your house, make sure you follow all gun safety procedures. Seek help if you have been physically or sexually abused. What's next? Visit your health care provider once a year for an annual wellness visit. Ask your health care provider how often you should have your eyes and teeth checked. Stay up to date on all vaccines. This information is not intended to replace advice given to you by your health care provider. Make sure you discuss any questions you have with your health care provider. Document Revised: 11/28/2020 Document Reviewed: 11/28/2020 Elsevier Patient Education  2024 Elsevier Inc.  

## 2023-04-15 NOTE — Assessment & Plan Note (Addendum)
Prescribed veozah.  This is working pretty well for her.  Update LFT's today.

## 2023-04-15 NOTE — Progress Notes (Signed)
Melissa Warner - 57 y.o. female MRN [id number]  Date of birth: 10-13-1965  Subjective Chief Complaint  Patient presents with   Annual Exam    HPI Melissa Warner is a 57 y.o. female here today for annual exam.   She reports that she is doing well.   She continues to stay fairly active.  She feels that diet is pretty good.   Non-smoker.  1 glass of wine nightly.   Seeing dentist regularly.   Review of Systems  Constitutional:  Negative for chills, fever, malaise/fatigue and weight loss.  HENT:  Negative for congestion, ear pain and sore throat.   Eyes:  Negative for blurred vision, double vision and pain.  Respiratory:  Negative for cough and shortness of breath.   Cardiovascular:  Negative for chest pain and palpitations.  Gastrointestinal:  Negative for abdominal pain, blood in stool, constipation, heartburn and nausea.  Genitourinary:  Negative for dysuria and urgency.  Musculoskeletal:  Negative for joint pain and myalgias.  Neurological:  Negative for dizziness and headaches.  Endo/Heme/Allergies:  Does not bruise/bleed easily.  Psychiatric/Behavioral:  Negative for depression. The patient is not nervous/anxious and does not have insomnia.     No Known Allergies  Past Medical History:  Diagnosis Date   Depression    Lyme disease     Past Surgical History:  Procedure Laterality Date   BREAST EXCISIONAL BIOPSY Right 2009   benign   CESAREAN SECTION      Social History   Socioeconomic History   Marital status: Married    Spouse name: Verbie Boutros   Number of children: Not on file   Years of education: Not on file   Highest education level: Not on file  Occupational History   Not on file  Tobacco Use   Smoking status: Never    Passive exposure: Never   Smokeless tobacco: Never  Vaping Use   Vaping status: Never Used  Substance and Sexual Activity   Alcohol use: Yes    Alcohol/week: 7.0 standard drinks of alcohol    Types: 7 Glasses of wine per week   Drug use:  Never   Sexual activity: Yes    Partners: Male  Other Topics Concern   Not on file  Social History Narrative   Not on file   Social Determinants of Health   Financial Resource Strain: Not on file  Food Insecurity: Not on file  Transportation Needs: Not on file  Physical Activity: Not on file  Stress: Not on file  Social Connections: Not on file    Family History  Problem Relation Age of Onset   Cancer Mother    Breast cancer Mother 81   Cancer Brother    Pancreatic cancer Brother    Colon cancer Neg Hx    Esophageal cancer Neg Hx    Stomach cancer Neg Hx    Rectal cancer Neg Hx     Health Maintenance  Topic Date Due   HIV Screening  Never done   Hepatitis C Screening  Never done   COVID-19 Vaccine (4 - 2023-24 season) 05/01/2023 (Originally 02/15/2023)   Cervical Cancer Screening (HPV/Pap Cotest)  10/16/2024   MAMMOGRAM  11/04/2024   DTaP/Tdap/Td (3 - Td or Tdap) 06/16/2029   Colonoscopy  08/25/2029   INFLUENZA VACCINE  Completed   Zoster Vaccines- Shingrix  Completed   HPV VACCINES  Aged Out     ----------------------------------------------------------------------------------------------------------------------------------------------------------------------------------------------------------------- Physical Exam BP 122/71 (BP Location: Left Arm, Patient Position: Sitting, Cuff Size: Normal)  Pulse 80   Ht 5' 5.65" (1.668 m)   Wt 157 lb (71.2 kg)   SpO2 100%   BMI 25.61 kg/m   Physical Exam Constitutional:      General: She is not in acute distress. HENT:     Head: Normocephalic and atraumatic.     Right Ear: Tympanic membrane and ear canal normal.     Left Ear: Tympanic membrane and ear canal normal.     Nose: Nose normal.  Eyes:     General: No scleral icterus.    Conjunctiva/sclera: Conjunctivae normal.  Neck:     Thyroid: No thyromegaly.  Cardiovascular:     Rate and Rhythm: Normal rate and regular rhythm.     Heart sounds: Normal heart  sounds.  Pulmonary:     Effort: Pulmonary effort is normal.     Breath sounds: Normal breath sounds.  Abdominal:     General: Bowel sounds are normal. There is no distension.     Palpations: Abdomen is soft.     Tenderness: There is no abdominal tenderness. There is no guarding.  Musculoskeletal:        General: Normal range of motion.     Cervical back: Normal range of motion and neck supple.  Lymphadenopathy:     Cervical: No cervical adenopathy.  Skin:    General: Skin is warm and dry.     Findings: No rash.  Neurological:     General: No focal deficit present.     Mental Status: She is alert and oriented to person, place, and time.     Cranial Nerves: No cranial nerve deficit.     Coordination: Coordination normal.  Psychiatric:        Mood and Affect: Mood normal.        Behavior: Behavior normal.     ------------------------------------------------------------------------------------------------------------------------------------------------------------------------------------------------------------------- Assessment and Plan  Hot flashes Prescribed veozah.  This is working pretty well for her.  Update LFT's today.   Well adult exam Well adult Orders Placed This Encounter  Procedures   CBC with Differential/Platelet   Lipid Panel With LDL/HDL Ratio   Basic Metabolic Panel (BMET)   Hepatic function panel   CEA   Cancer antigen 19-9  Screenings: Per lab orders Immunizations: She will get flu vaccine at work. Anticipatory guidance/risk factor reduction: Recommendations per AVS.   No orders of the defined types were placed in this encounter.   No follow-ups on file.    This visit occurred during the SARS-CoV-2 public health emergency.  Safety protocols were in place, including screening questions prior to the visit, additional usage of staff PPE, and extensive cleaning of exam room while observing appropriate contact time as indicated for disinfecting  solutions.

## 2023-04-16 LAB — LIPID PANEL WITH LDL/HDL RATIO
Cholesterol, Total: 191 mg/dL (ref 100–199)
HDL: 93 mg/dL (ref >39)
LDL Chol Calc (NIH): 88 mg/dL (ref 0–99)
LDL/HDL Ratio: 0.9 ratio (ref 0.0–3.2)
Triglycerides: 54 mg/dL (ref 0–149)
VLDL Cholesterol Cal: 10 mg/dL (ref 5–40)

## 2023-04-16 LAB — CBC WITH DIFFERENTIAL/PLATELET
Basophils Absolute: 0 10*3/uL (ref 0.0–0.2)
Basos: 1 %
EOS (ABSOLUTE): 0.1 10*3/uL (ref 0.0–0.4)
Eos: 2 %
Hematocrit: 40.4 % (ref 34.0–46.6)
Hemoglobin: 13.3 g/dL (ref 11.1–15.9)
Immature Grans (Abs): 0 10*3/uL (ref 0.0–0.1)
Immature Granulocytes: 0 %
Lymphocytes Absolute: 1.7 10*3/uL (ref 0.7–3.1)
Lymphs: 34 %
MCH: 31.1 pg (ref 26.6–33.0)
MCHC: 32.9 g/dL (ref 31.5–35.7)
MCV: 95 fL (ref 79–97)
Monocytes Absolute: 0.3 10*3/uL (ref 0.1–0.9)
Monocytes: 6 %
Neutrophils Absolute: 2.9 10*3/uL (ref 1.4–7.0)
Neutrophils: 57 %
Platelets: 267 10*3/uL (ref 150–450)
RBC: 4.27 x10E6/uL (ref 3.77–5.28)
RDW: 12 % (ref 11.7–15.4)
WBC: 5.1 10*3/uL (ref 3.4–10.8)

## 2023-04-16 LAB — BASIC METABOLIC PANEL
BUN/Creatinine Ratio: 18 (ref 9–23)
BUN: 13 mg/dL (ref 6–24)
CO2: 26 mmol/L (ref 20–29)
Calcium: 9.7 mg/dL (ref 8.7–10.2)
Chloride: 102 mmol/L (ref 96–106)
Creatinine, Ser: 0.73 mg/dL (ref 0.57–1.00)
Glucose: 85 mg/dL (ref 70–99)
Potassium: 4.5 mmol/L (ref 3.5–5.2)
Sodium: 141 mmol/L (ref 134–144)
eGFR: 95 mL/min/{1.73_m2} (ref >59)

## 2023-04-16 LAB — HEPATIC FUNCTION PANEL
ALT: 21 [IU]/L (ref 0–32)
AST: 23 [IU]/L (ref 0–40)
Albumin: 4.5 g/dL (ref 3.8–4.9)
Alkaline Phosphatase: 68 [IU]/L (ref 44–121)
Bilirubin Total: 0.2 mg/dL (ref 0.0–1.2)
Bilirubin, Direct: <0.1 mg/dL (ref 0.00–0.40)
Total Protein: 6.6 g/dL (ref 6.0–8.5)

## 2023-04-16 LAB — CANCER ANTIGEN 19-9: CA 19-9: 8 U/mL (ref 0–35)

## 2023-04-16 LAB — CEA: CEA: 0.7 ng/mL (ref 0.0–4.7)

## 2023-06-12 ENCOUNTER — Other Ambulatory Visit: Payer: Self-pay | Admitting: Family Medicine

## 2023-06-12 DIAGNOSIS — G4700 Insomnia, unspecified: Secondary | ICD-10-CM

## 2023-06-23 ENCOUNTER — Other Ambulatory Visit: Payer: Self-pay | Admitting: Family Medicine

## 2023-06-23 DIAGNOSIS — F419 Anxiety disorder, unspecified: Secondary | ICD-10-CM

## 2023-06-29 NOTE — Telephone Encounter (Signed)
 Request rx rf of veozah 45mg  Last written 06/14/2023 Last OV 04/15/2023 Upcoming appt = none

## 2023-07-16 ENCOUNTER — Other Ambulatory Visit: Payer: Self-pay | Admitting: Family Medicine

## 2023-07-30 ENCOUNTER — Other Ambulatory Visit: Payer: Self-pay | Admitting: Family Medicine

## 2023-07-30 ENCOUNTER — Encounter: Payer: Self-pay | Admitting: Family Medicine

## 2023-07-30 ENCOUNTER — Telehealth (INDEPENDENT_AMBULATORY_CARE_PROVIDER_SITE_OTHER): Payer: Managed Care, Other (non HMO) | Admitting: Family Medicine

## 2023-07-30 DIAGNOSIS — J111 Influenza due to unidentified influenza virus with other respiratory manifestations: Secondary | ICD-10-CM | POA: Diagnosis not present

## 2023-07-30 MED ORDER — HYDROCODONE BIT-HOMATROP MBR 5-1.5 MG/5ML PO SOLN
5.0000 mL | Freq: Four times a day (QID) | ORAL | 0 refills | Status: DC | PRN
Start: 2023-07-30 — End: 2023-08-12

## 2023-07-30 MED ORDER — OSELTAMIVIR PHOSPHATE 75 MG PO CAPS: 75.0000 mg | ORAL_CAPSULE | Freq: Two times a day (BID) | ORAL | 0 refills | Status: AC

## 2023-07-30 NOTE — Progress Notes (Signed)
Melissa Warner - 58 y.o. female MRN [id number]  Date of birth: [birth year]   This visit type was conducted due to national recommendations for restrictions regarding the COVID-19 Pandemic (e.g. social distancing).  This format is felt to be most appropriate for this patient at this time.  All issues noted in this document were discussed and addressed.  No physical exam was performed (except for noted visual exam findings with Video Visits).  I discussed the limitations of evaluation and management by telemedicine and the availability of in person appointments. The patient expressed understanding and agreed to proceed.  I connected withNAME@ on 07/30/23 at  2:50 PM EST by a video enabled telemedicine application and verified that I am speaking with the correct person using two identifiers.  Present at visit: Everrett Coombe, DO [name]   Patient Location: Home [address] [place] [address]   Provider location:   [facility]  Chief Complaint  Patient presents with   Influenza    HPI  Melissa Warner is a 58 y.o. female who presents via audio/video conferencing for a telehealth visit today.  She has fever, body aches, chills, headache, cough and congestion.  Symptoms started 2 days ago.  She is using OTC medications to help with symptoms.  Cough is keeping her awake at night.  She has not tested for flu but has has exposure.  She denies dyspnea or wheezing   ROS:  A comprehensive ROS was completed and negative except as noted per HPI  Past Medical History:  Diagnosis Date   Depression    Lyme disease     Past Surgical History:  Procedure Laterality Date   BREAST EXCISIONAL BIOPSY Right 2009   benign   CESAREAN SECTION      Family History  Problem Relation Age of Onset   Cancer Mother    Breast cancer Mother 39   Cancer Brother    Pancreatic cancer Brother    Colon cancer Neg Hx    Esophageal cancer Neg Hx    Stomach cancer Neg Hx    Rectal cancer Neg Hx     Social History    Socioeconomic History   Marital status: Married    Spouse name: Shaima Sardinas   Number of children: Not on file   Years of education: Not on file   Highest education level: Not on file  Occupational History   Not on file  Tobacco Use   Smoking status: Never    Passive exposure: Never   Smokeless tobacco: Never  Vaping Use   Vaping status: Never Used  Substance and Sexual Activity   Alcohol use: Yes    Alcohol/week: 7.0 standard drinks of alcohol    Types: 7 Glasses of wine per week   Drug use: Never   Sexual activity: Yes    Partners: Male  Other Topics Concern   Not on file  Social History Narrative   Not on file   Social Drivers of Health   Financial Resource Strain: Not on file  Food Insecurity: Not on file  Transportation Needs: Not on file  Physical Activity: Not on file  Stress: Not on file  Social Connections: Not on file  Intimate Partner Violence: Not on file     Current Outpatient Medications:    clobetasol cream (TEMOVATE) 0.05 %, SMARTSIG:sparingly Topical Twice Daily, Disp: , Rfl:    FLUoxetine (PROZAC) 20 MG capsule, TAKE 1 CAPSULE DAILY, Disp: 90 capsule, Rfl: 0   HYDROcodone bit-homatropine (HYDROMET) 5-1.5 MG/5ML  syrup, Take 5 mLs by mouth every 6 (six) hours as needed for cough., Disp: 150 mL, Rfl: 0   naproxen (NAPROSYN) 500 MG tablet, TAKE 1 TABLET TWICE DAILY  WITH MEALS, Disp: 180 tablet, Rfl: 0   oseltamivir (TAMIFLU) 75 MG capsule, Take 1 capsule (75 mg total) by mouth 2 (two) times daily for 5 days., Disp: 10 capsule, Rfl: 0   venlafaxine XR (EFFEXOR-XR) 75 MG 24 hr capsule, TAKE 1 CAPSULE DAILY WITH  BREAKFAST, Disp: 90 capsule, Rfl: 0   VEOZAH 45 MG TABS, TAKE 1 TABLET BY MOUTH EVERY DAY, Disp: 30 tablet, Rfl: 0   VEOZAH 45 MG TABS, TAKE 1 TABLET BY MOUTH EVERY DAY, Disp: 30 tablet, Rfl: 0   zolpidem (AMBIEN) 10 MG tablet, TAKE 1 TABLET BY MOUTH EVERYDAY AT BEDTIME, Disp: 30 tablet, Rfl: 3  EXAM:  VITALS per patient if applicable: There  were no vitals taken for this visit.  GENERAL: alert, oriented, appears well and in no acute distress  HEENT: atraumatic, conjunttiva clear, no obvious abnormalities on inspection of external nose and ears  NECK: normal movements of the head and neck  LUNGS: on inspection no signs of respiratory distress, breathing rate appears normal, no obvious gross SOB, gasping or wheezing  CV: no obvious cyanosis  MS: moves all visible extremities without noticeable abnormality  PSYCH/NEURO: pleasant and cooperative, no obvious depression or anxiety, speech and thought processing grossly intact  ASSESSMENT AND PLAN:  Discussed the following assessment and plan:  Influenza Symptoms consistent with influenza.  Covering empirically with tamiflu x5 day course.  Adding hydromet cough syrup as needed.  Continue supportive care.      I discussed the assessment and treatment plan with the patient. The patient was provided an opportunity to ask questions and all were answered. The patient agreed with the plan and demonstrated an understanding of the instructions.   The patient was advised to call back or seek an in-person evaluation if the symptoms worsen or if the condition fails to improve as anticipated.    Everrett Coombe, DO

## 2023-07-30 NOTE — Assessment & Plan Note (Signed)
Symptoms consistent with influenza.  Covering empirically with tamiflu x5 day course.  Adding hydromet cough syrup as needed.  Continue supportive care.

## 2023-07-30 NOTE — Telephone Encounter (Signed)
Copied from CRM [id number]. Topic: Clinical - Prescription Issue >> Jul 30, 2023  3:12 PM Nila Nephew wrote: Reason for CRM: Patient is requesting cough medicine [facility] in [place] - [address], [place]. Patient requesting Tamiflu be sent there as well.

## 2023-07-30 NOTE — Progress Notes (Signed)
Sx: started Tuesday with cough by end of shift add sore throat and body aches.   Currently taking Naproxen, Nyquil (last night)

## 2023-08-12 ENCOUNTER — Ambulatory Visit (INDEPENDENT_AMBULATORY_CARE_PROVIDER_SITE_OTHER): Payer: Managed Care, Other (non HMO) | Admitting: Sports Medicine

## 2023-08-12 ENCOUNTER — Other Ambulatory Visit (INDEPENDENT_AMBULATORY_CARE_PROVIDER_SITE_OTHER): Payer: Self-pay

## 2023-08-12 ENCOUNTER — Encounter: Payer: Self-pay | Admitting: Sports Medicine

## 2023-08-12 ENCOUNTER — Ambulatory Visit: Payer: Managed Care, Other (non HMO)

## 2023-08-12 ENCOUNTER — Other Ambulatory Visit: Payer: Self-pay | Admitting: Sports Medicine

## 2023-08-12 DIAGNOSIS — M1812 Unilateral primary osteoarthritis of first carpometacarpal joint, left hand: Secondary | ICD-10-CM

## 2023-08-12 DIAGNOSIS — R058 Other specified cough: Secondary | ICD-10-CM

## 2023-08-12 DIAGNOSIS — R062 Wheezing: Secondary | ICD-10-CM | POA: Diagnosis not present

## 2023-08-12 MED ORDER — TRIAMCINOLONE ACETONIDE 40 MG/ML IJ SUSP: 20.0000 mg | Freq: Once | INTRAMUSCULAR | Status: DC

## 2023-08-12 MED ORDER — HYDROCOD POLI-CHLORPHE POLI ER 10-8 MG/5ML PO SUER: 5.0000 mL | Freq: Two times a day (BID) | ORAL | 0 refills | Status: DC | PRN

## 2023-08-12 MED ORDER — TRIAMCINOLONE ACETONIDE 40 MG/ML IJ SUSP
20.0000 mg | Freq: Once | INTRAMUSCULAR | Status: AC
  Administered 2023-08-12: 20 mg via INTRAMUSCULAR

## 2023-08-12 MED ORDER — PREDNISONE 50 MG PO TABS
50.0000 mg | ORAL_TABLET | Freq: Every day | ORAL | 0 refills | Status: DC
Start: 2023-08-12 — End: 2024-03-07

## 2023-08-12 MED ORDER — ALBUTEROL SULFATE HFA 108 (90 BASE) MCG/ACT IN AERS: 2.0000 | INHALATION_SPRAY | Freq: Four times a day (QID) | RESPIRATORY_TRACT | 11 refills | Status: DC | PRN

## 2023-08-12 NOTE — Assessment & Plan Note (Signed)
 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 Recurrence of pain, left CMC injection today.

## 2023-08-12 NOTE — Addendum Note (Signed)
 Addended by: Samule Dry on: 08/12/2023 12:00 PM   Modules accepted: Orders

## 2023-08-12 NOTE — Progress Notes (Signed)
    Procedures performed today:    Procedure: Real-time Ultrasound Guided injection of the left first [facility] Device: Samsung HS60  Verbal informed consent obtained.  Time-out conducted.  Noted no overlying erythema, induration, or other signs of local infection.  Skin prepped in a sterile fashion.  Local anesthesia: Topical Ethyl chloride.  With sterile technique and under real time ultrasound guidance: Arthritic joint noted, 0.5 cc lidocaine, 0.5 cc kenalog 40 injected easily. Completed without difficulty  Advised to call if fevers/chills, erythema, induration, drainage, or persistent bleeding.  Images permanently stored and available for review in PACS.  Impression: Technically successful ultrasound guided injection.  Independent interpretation of notes and tests performed by another provider:   None.  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 Recurrence of pain, left CMC injection today.  Post-viral cough syndrome Melissa Warner had the flu a couple weeks ago, she has a persistent cough, nonproductive. On exam she is not exhibiting any respiratory distress, speaking full sentences, she does have diffuse inspiratory and expiratory wheezes. Adding albuterol, 5 days of prednisone, chest x-ray, Tussionex. She will return if not doing a lot better in 2 to 3 weeks and we will consider antibiotics at that juncture.    ____________________________________________ Ihor Austin. Benjamin Stain, M.D., ABFM., CAQSM., AME. Primary Care and Sports Medicine [facility]  Adjunct Professor of Family Medicine  [place] [employer]  Restaurant manager, fast food

## 2023-08-12 NOTE — Assessment & Plan Note (Signed)
 Melissa Warner had the flu a couple weeks ago, she has a persistent cough, nonproductive. On exam she is not exhibiting any respiratory distress, speaking full sentences, she does have diffuse inspiratory and expiratory wheezes. Adding albuterol, 5 days of prednisone, chest x-ray, Tussionex. She will return if not doing a lot better in 2 to 3 weeks and we will consider antibiotics at that juncture.

## 2023-08-26 ENCOUNTER — Ambulatory Visit: Payer: Managed Care, Other (non HMO) | Admitting: Sports Medicine

## 2023-09-14 ENCOUNTER — Other Ambulatory Visit: Payer: Self-pay | Admitting: Family Medicine

## 2023-09-14 DIAGNOSIS — F419 Anxiety disorder, unspecified: Secondary | ICD-10-CM

## 2023-10-29 ENCOUNTER — Other Ambulatory Visit: Payer: Self-pay | Admitting: Family Medicine

## 2023-10-29 DIAGNOSIS — G4700 Insomnia, unspecified: Secondary | ICD-10-CM

## 2023-11-05 ENCOUNTER — Other Ambulatory Visit: Payer: Self-pay

## 2023-11-05 DIAGNOSIS — G4700 Insomnia, unspecified: Secondary | ICD-10-CM

## 2023-11-05 NOTE — Telephone Encounter (Signed)
 Patient requesting zolpidem  10mg   be changed to 90 day supply and [facility] Last written 10/30/2023 as qty #30 and 3 refills.

## 2023-11-05 NOTE — Telephone Encounter (Signed)
 Copied from CRM [id number]. Topic: Clinical - Prescription Issue >> Nov 05, 2023  9:32 AM Ileen Mallet G wrote: Reason for CRM: Patient would like her zolpidem  (AMBIEN ) 10 MG table  changed to a 90 day supply because it is cheaper. Please advise     [id number] [place], [place] - [address] [place] [address] Phone: [phone] Fax: [phone] Hours: Not open 24 hours

## 2023-11-06 MED ORDER — ZOLPIDEM TARTRATE 10 MG PO TABS: 10.0000 mg | ORAL_TABLET | Freq: Every evening | ORAL | 1 refills | Status: DC | PRN

## 2023-12-07 ENCOUNTER — Other Ambulatory Visit: Payer: Self-pay | Admitting: Family Medicine

## 2023-12-07 DIAGNOSIS — Z1231 Encounter for screening mammogram for malignant neoplasm of breast: Secondary | ICD-10-CM

## 2023-12-09 ENCOUNTER — Ambulatory Visit

## 2023-12-13 ENCOUNTER — Other Ambulatory Visit: Payer: Self-pay | Admitting: Family Medicine

## 2023-12-13 DIAGNOSIS — F419 Anxiety disorder, unspecified: Secondary | ICD-10-CM

## 2023-12-18 ENCOUNTER — Other Ambulatory Visit: Payer: Self-pay | Admitting: Medical Genetics

## 2023-12-22 ENCOUNTER — Other Ambulatory Visit

## 2023-12-22 ENCOUNTER — Ambulatory Visit
Admission: RE | Admit: 2023-12-22 | Discharge: 2023-12-22 | Disposition: A | Discharge status: Home / Self Care | Source: Ambulatory Visit | Attending: Family Medicine | Admitting: Family Medicine

## 2023-12-22 DIAGNOSIS — Z1231 Encounter for screening mammogram for malignant neoplasm of breast: Secondary | ICD-10-CM

## 2024-01-13 IMAGING — MG MM DIGITAL SCREENING BILAT W/ TOMO AND CAD
8 series · 8 of 24 positions shown · non-contrast
Comparison: Previous exam(s).

CLINICAL DATA: Screening.

EXAM:
DIGITAL SCREENING BILATERAL MAMMOGRAM WITH TOMOSYNTHESIS AND CAD
TECHNIQUE: Bilateral screening digital craniocaudal and mediolateral oblique
mammograms were obtained. Bilateral screening digital breast
tomosynthesis was performed. The images were evaluated with
computer-aided detection.

[R CC synth-2D]
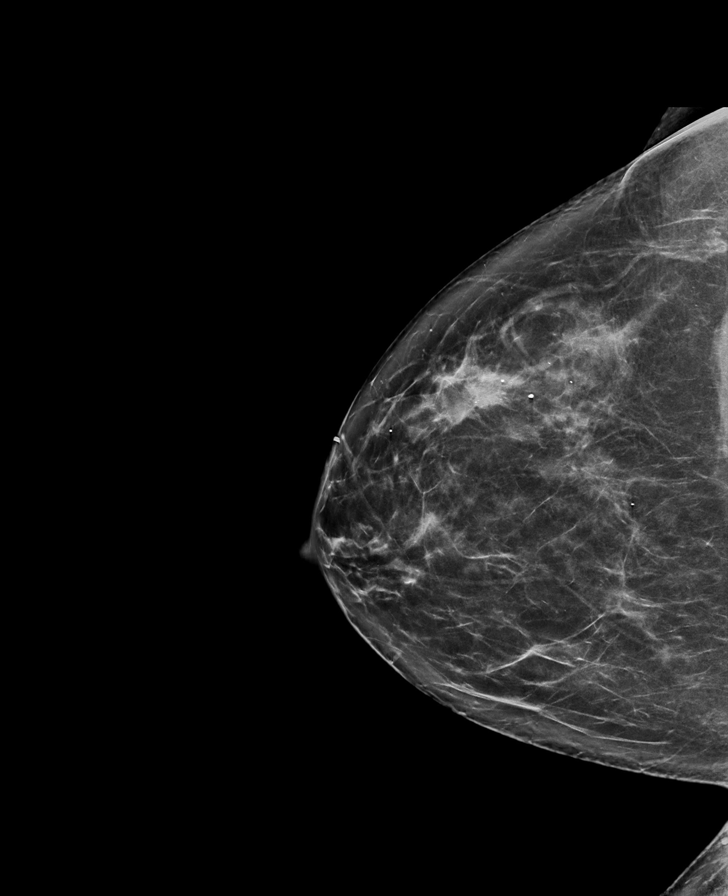

[R MLO synth-2D]
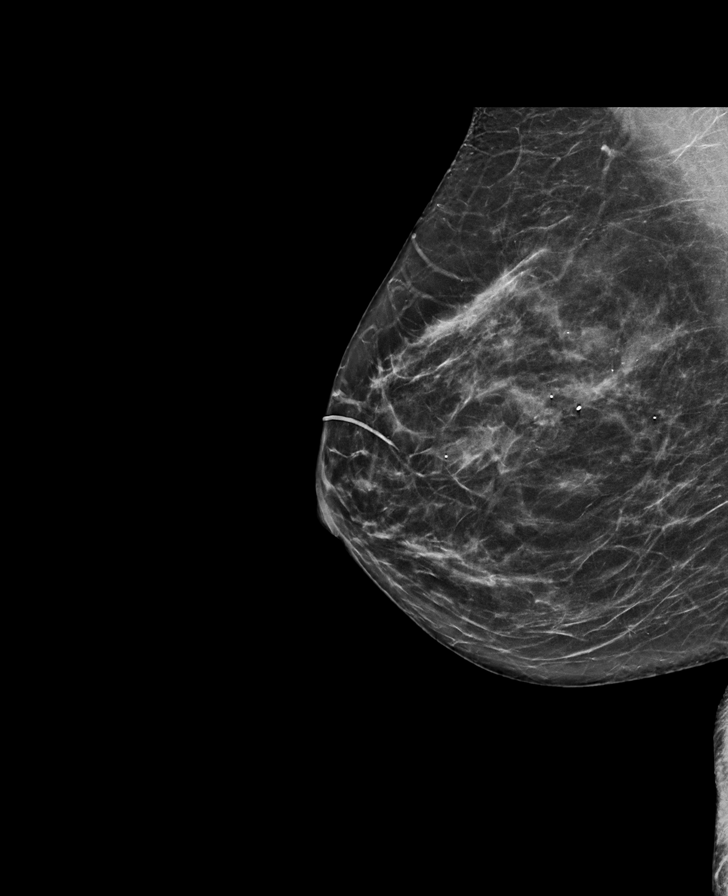

[L CC synth-2D]
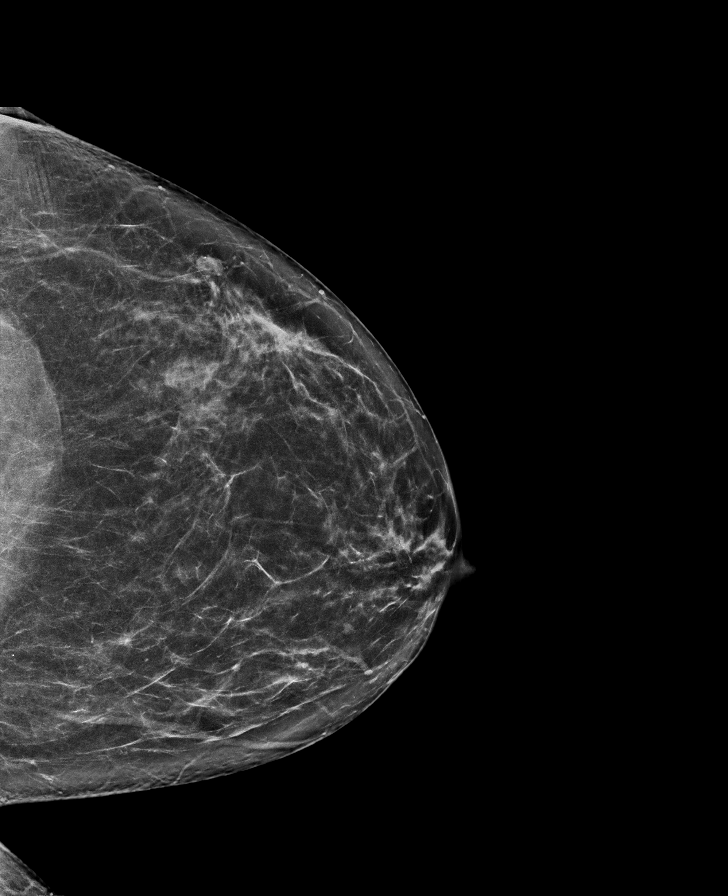

[L MLO synth-2D]
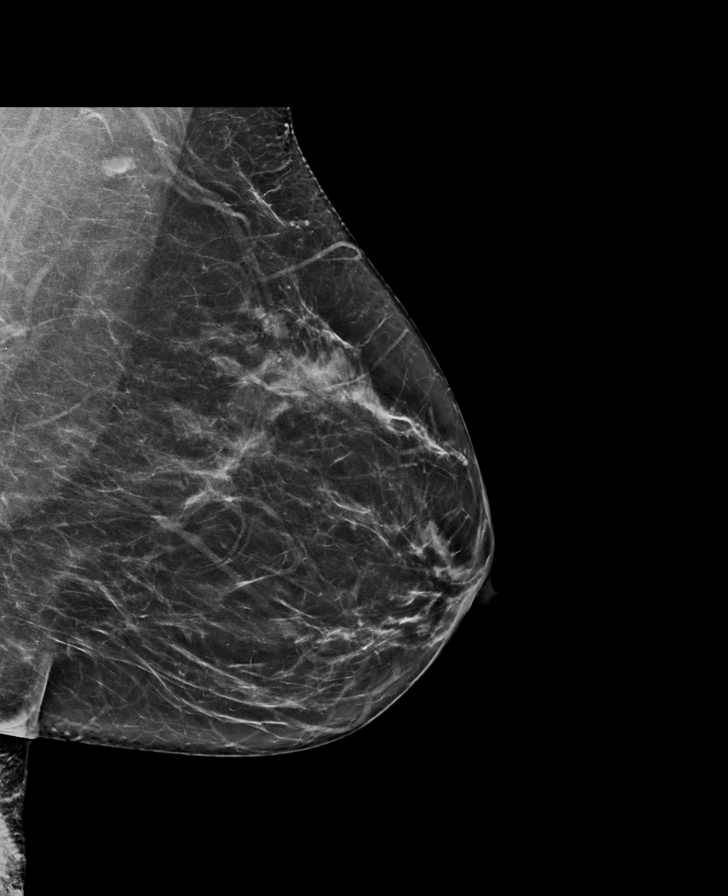

[R CC tomo · tomo slice 39/77.0]
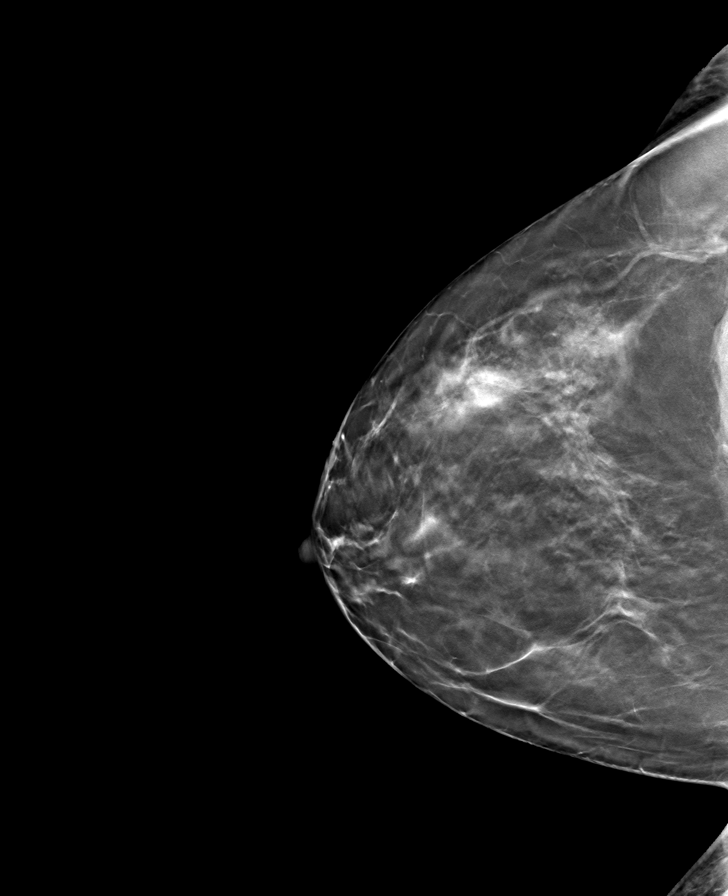

[L MLO tomo · tomo slice 37/74.0]
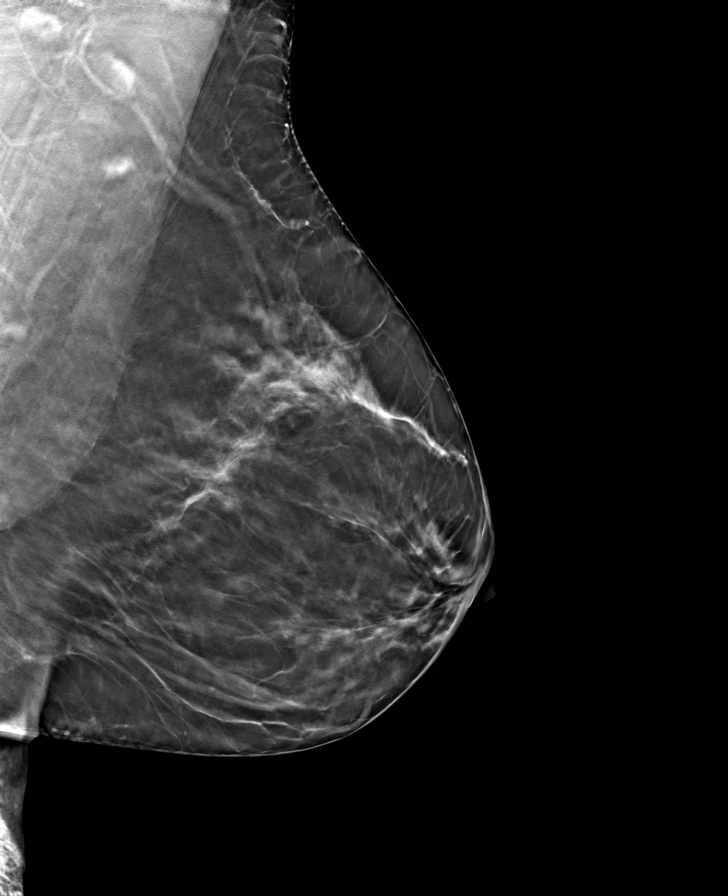

[L CC tomo · tomo slice 38/75.0]
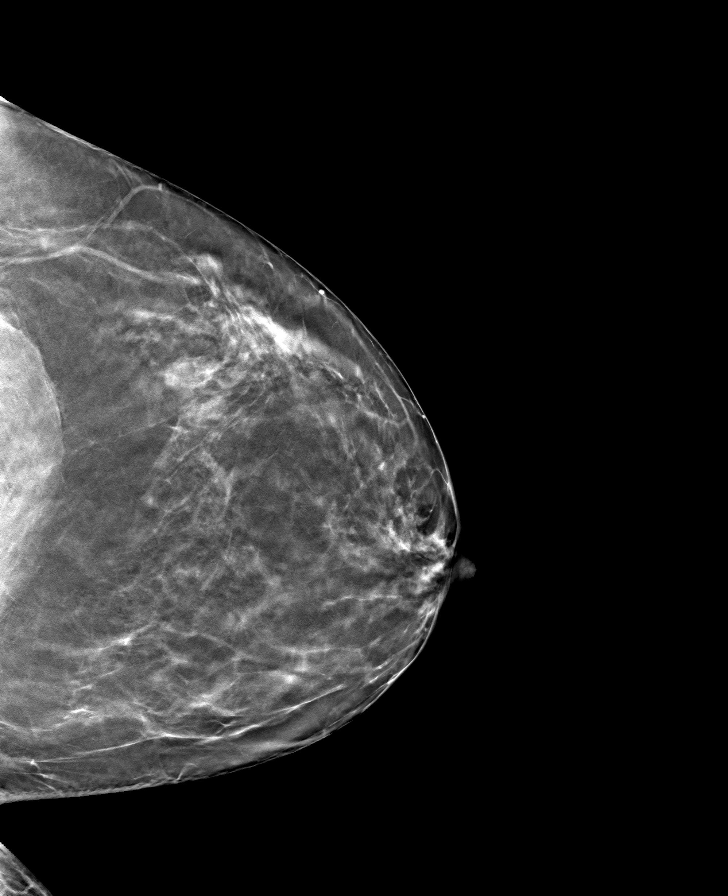

[R MLO tomo · tomo slice 35/69.0]
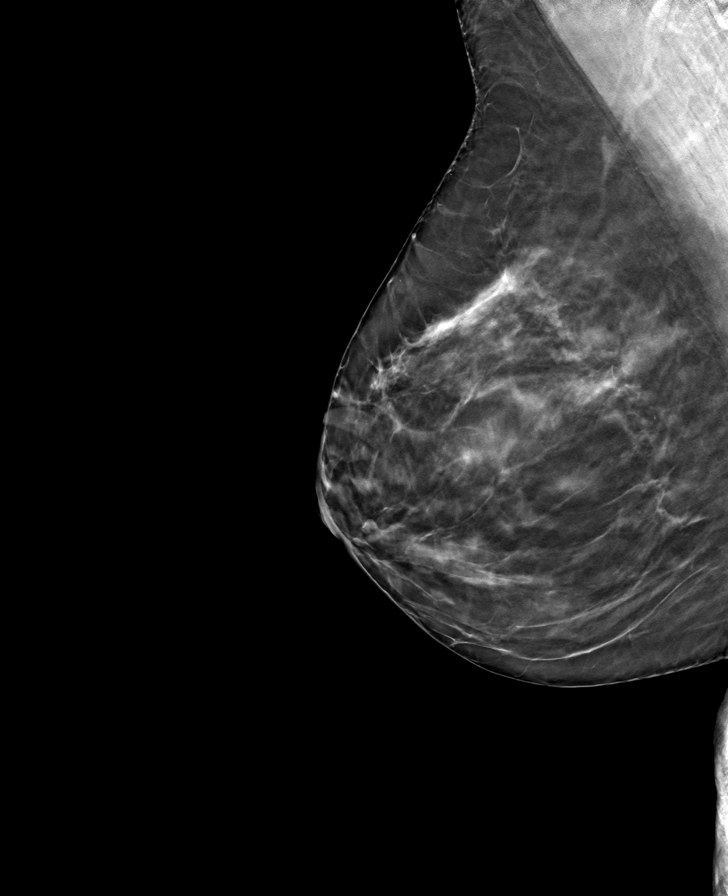

[8 of 24 positions shown; findings below may reference images not displayed]

ACR Breast Density Category b: There are scattered areas of
fibroglandular density.
FINDINGS: There are no findings suspicious for malignancy.
IMPRESSION: No mammographic evidence of malignancy. A result letter of this
screening mammogram will be mailed directly to the patient.

RECOMMENDATION:
Screening mammogram in one year. (Code:51-O-LD2)

BI-RADS CATEGORY  1: Negative.

## 2024-02-16 ENCOUNTER — Encounter: Payer: Self-pay | Admitting: Sports Medicine

## 2024-02-24 ENCOUNTER — Encounter: Payer: Self-pay | Admitting: Family Medicine

## 2024-03-02 ENCOUNTER — Telehealth: Payer: Self-pay | Admitting: *Deleted

## 2024-03-02 ENCOUNTER — Encounter: Payer: Self-pay | Admitting: Psychiatry

## 2024-03-02 NOTE — Telephone Encounter (Signed)
 LMOM for the patient to call the office back. Patient needs a new patient appt scheduled for 9/22 with Dr Eldonna. (First available)

## 2024-03-02 NOTE — Telephone Encounter (Signed)
 Spoke with Ms. Pore regarding her referral to GYN oncology. She has an appointment scheduled with Dr. Eldonna on 03/07/24 at 0900. Patient agrees to date and time. She has been provided with office address and location. She is also aware of our mask and visitor policy. Patient verbalized understanding and will call with any questions.

## 2024-03-04 NOTE — Patient Instructions (Addendum)
 It was a pleasure to see you in clinic today. - We discussed topical aldara  cream - Recommend that you start this weekly and increase to 3 times a week.  - We will see you back in 8 weeks to monitor the progress.  Thank you very much for allowing me to provide care for you today.  I appreciate your confidence in choosing our Gynecologic Oncology team at [facility].  If you have any questions about your visit today please call our office or send us  a MyChart message and we will get back to you as soon as possible.

## 2024-03-07 ENCOUNTER — Encounter: Payer: Self-pay | Admitting: Psychiatry

## 2024-03-07 ENCOUNTER — Inpatient Hospital Stay: Attending: Psychiatry | Admitting: Psychiatry

## 2024-03-07 VITALS — BP 126/72 | HR 73 | Temp 98.1°F | Resp 18 | Ht 67.0 in | Wt 150.8 lb

## 2024-03-07 DIAGNOSIS — Z803 Family history of malignant neoplasm of breast: Secondary | ICD-10-CM | POA: Diagnosis not present

## 2024-03-07 DIAGNOSIS — A6920 Lyme disease, unspecified: Secondary | ICD-10-CM | POA: Insufficient documentation

## 2024-03-07 DIAGNOSIS — Z79899 Other long term (current) drug therapy: Secondary | ICD-10-CM | POA: Insufficient documentation

## 2024-03-07 DIAGNOSIS — B977 Papillomavirus as the cause of diseases classified elsewhere: Secondary | ICD-10-CM | POA: Diagnosis not present

## 2024-03-07 DIAGNOSIS — Z800 Family history of malignant neoplasm of digestive organs: Secondary | ICD-10-CM | POA: Diagnosis not present

## 2024-03-07 DIAGNOSIS — N901 Moderate vulvar dysplasia: Secondary | ICD-10-CM | POA: Diagnosis not present

## 2024-03-07 DIAGNOSIS — Z791 Long term (current) use of non-steroidal anti-inflammatories (NSAID): Secondary | ICD-10-CM | POA: Insufficient documentation

## 2024-03-07 DIAGNOSIS — F32A Depression, unspecified: Secondary | ICD-10-CM | POA: Diagnosis not present

## 2024-03-07 MED ORDER — IMIQUIMOD 5 % EX CREA: TOPICAL_CREAM | CUTANEOUS | 0 refills | Status: AC

## 2024-03-07 NOTE — Progress Notes (Addendum)
 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  We reviewed the nature of vulvar dysplasia. Surgical excision is the mainstay of treatment, but ablative therapy or pharmacologic treatment is an option for some patients in certain clinical scenarios. The goals of treatment of vulvar dysplasia are to prevent development of vulvar squamous carcinoma and relieve any associated symptoms, such as pain or itching. The goal is also preserve vulvar anatomy as best as possible.  The secondary benefit of surgical excision is to rule out any area of invasive disease.  However, given the patient's location of her lesion to be on the clitoris, recommend against surgical excision at this time.  Reviewed pros and cons of topical treatment and laser treatment.  Additionally reviewed risk of neuropathic pain that is slightly higher with laser treatment compared to topical treatment.  Given the location on the clitoris, considering all these things, recommend topical treatment.  Prescription for Aldara  sent.  Written instructions for application reviewed.  Side effects reviewed.  Reviewed treatment for 16 weeks.  Will repeat exam in 8 weeks to monitor progress.  RTC 8 weeks.    A copy of this note was sent to the patient's referring provider.  Hoy Masters, MD Gynecologic Oncology   Medical Decision Making I personally spent  TOTAL 55 minutes face-to-face and non-face-to-face in the care of this patient, which includes all pre, intra, and post visit time on the date of service.   ------------  CC: vulvar dysplasia  HISTORY OF PRESENT ILLNESS:  Melissa Warner is a 58 y.o. woman who is seen in consultation at the request of Evalene Smiles, MD for evaluation of vulvar dysplasia.  Patient was seen on 02/24/2024 with her OB/GYN for an annual exam.   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  She reported rare use of topical steroids.  A vulvar biopsy was obtained of the right side of the clitoral hood.  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  She had previously undergone a biopsy on 02/08/2024 which showed dysplasia, I am able to adequately distinguish between low-grade and high-grade.  Pap smear on 02/24/2024 returned with ASCUS, HPV high-risk negative.  Today patient reports that her and her husband first noted the area about 2 years ago.  She denies any itching or bleeding.  Still asymptomatic at this time.  Weight appearance of the area.  Reports that this may have been first biopsied in May 2023.  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  She reports rare use of the topical steroids but has had spurts of using it more consistently.  Does have a family history of breast cancer and pancreatic cancer.  Reports negative genetic testing in the family.  Addendum: Following visit, was able to obtain patient's original biopsy of the clitoris from 10/15/2021.  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  PAST MEDICAL HISTORY: Past Medical History:  Diagnosis Date   Depression    Lyme disease     PAST SURGICAL HISTORY: Past Surgical History:  Procedure Laterality Date   BREAST EXCISIONAL BIOPSY Right 2009   benign   CESAREAN SECTION      OB/GYN HISTORY: OB History  Gravida Para Term Preterm AB Living  4 3 3  1 3   SAB IAB Ectopic Multiple Live Births  1    3    # Outcome Date GA Lbr Len/2nd Weight Sex Type Anes PTL Lv  4 SAB           3 Term      CS-LTranv   LIV  2 Term      Vag-Spont   LIV  1 Term      Vag-Spont   LIV      Age at menarche: 40 Age at menopause: 40 Hx of HRT: no Hx of STI: no Last pap: 02/24/2024 ASCUS HPV HR  negative History of abnormal pap smears: abnormal age 79, normal since  SCREENING STUDIES:  Last mammogram: 12/2023 Last colonoscopy: 2020 (10 year follow-up)  MEDICATIONS:  Current Outpatient Medications:    clobetasol cream (TEMOVATE) 0.05 %, SMARTSIG:sparingly Topical Twice Daily, Disp: , Rfl:    Fezolinetant  (VEOZAH ) 45 MG TABS, TAKE 1 TABLET BY MOUTH EVERY DAY, Disp: 30 tablet, Rfl: 3   FLUoxetine  (PROZAC ) 20 MG capsule, TAKE 1 CAPSULE DAILY, Disp: 90 capsule, Rfl: 0   naproxen  (NAPROSYN ) 500 MG tablet, TAKE 1 TABLET TWICE DAILY  WITH MEALS (Patient taking differently: Take 500 mg by mouth 2 (two) times daily with a meal. As needed only), Disp: 180 tablet, Rfl: 0   venlafaxine  XR (EFFEXOR -XR) 75 MG 24 hr capsule, TAKE 1 CAPSULE DAILY WITH  BREAKFAST, Disp: 90 capsule, Rfl: 0   VEOZAH  45 MG TABS, TAKE 1 TABLET BY MOUTH EVERY DAY, Disp: 30 tablet, Rfl: 0   zolpidem  (AMBIEN ) 10 MG tablet, Take 1 tablet (10 mg total) by mouth at bedtime as needed for sleep. (Patient not taking: Reported on 03/02/2024), Disp: 90 tablet, Rfl: 1  ALLERGIES: No Known Allergies  FAMILY HISTORY: Family History  Problem Relation Age of Onset   Breast cancer Mother 15   Pancreatic cancer Brother    Breast cancer Maternal Aunt    Colon cancer Neg Hx    Esophageal cancer Neg Hx    Stomach cancer Neg Hx    Rectal cancer Neg Hx    Ovarian cancer Neg Hx    Endometrial cancer Neg Hx    Prostate cancer Neg Hx     SOCIAL HISTORY: Social History   Socioeconomic History   Marital status: Married    Spouse name: Dennys Traughber   Number of children: Not on file   Years of education: Not on file   Highest education level: Not on file  Occupational History   Not on file  Tobacco Use   Smoking status: Never    Passive exposure: Never   Smokeless tobacco: Never  Vaping Use   Vaping status: Never Used  Substance and Sexual Activity   Alcohol use: Yes    Alcohol/week: 7.0 standard drinks of alcohol    Types:  7 Glasses of wine per week   Drug use: Never   Sexual activity: Yes    Partners: Male  Other Topics Concern   Not on file  Social History Narrative   Not on file   Social Drivers of Health   Financial Resource Strain: Not on file  Food Insecurity: No Food Insecurity (03/02/2024)   Hunger Vital Sign    Worried About Running Out of Food in the Last Year: Never true    Ran Out of Food in the Last Year: Never true  Transportation Needs: No Transportation Needs (03/02/2024)   PRAPARE - Transportation  Lack of Transportation (Medical): No    Lack of Transportation (Non-Medical): No  Physical Activity: Not on file  Stress: Not on file  Social Connections: Not on file  Intimate Partner Violence: Not on file    REVIEW OF SYSTEMS: New patient intake form was reviewed.  Complete 10-system review is negative except for the following: none  PHYSICAL EXAM: BP 126/72 (BP Location: Left Arm, Patient Position: Sitting)   Pulse 73   Temp 98.1 F (36.7 C) (Oral)   Resp 18   Ht 5' 7 (1.702 m)   Wt 150 lb 12.8 oz (68.4 kg)   SpO2 100%   BMI 23.62 kg/m  Constitutional: No acute distress. Neuro/Psych: Alert, oriented.  Head and Neck: Normocephalic, atraumatic. Neck symmetric without masses. Sclera anicteric.  Respiratory: Normal work of breathing. Clear to auscultation bilaterally. Cardiovascular: Regular rate and rhythm, no murmurs, rubs, or gallops. Abdomen: Normoactive bowel sounds. Soft, non-distended, non-tender to palpation. Well healed incisions. Extremities: Grossly normal range of motion. Warm, well perfused. No edema bilaterally. Skin: No rashes or lesions. Lymphatic: No cervical, supraclavicular, or inguinal adenopathy. Genitourinary: External genitalia with white verrucous changes of the clitoris proper.  No other gross lesions on the vulva. Urethral meatus without lesions or prolapse. On speculum exam, atrophic vagina.  Cervix with ectropion.  Bimanual exam reveals soft cervix  and small mobile uterus.  No palpable adnexal masses.  Exam chaperoned by Eleanor Epps, NP  VULVAR COLPOSCOPY PROCEDURE NOTE  Procedure Details: After appropriate verbal informed consent was obtained, a timeout was performed. Acetic acid was applied to the vulva and the vulva was inspected with the colposcope with the findings as noted below. The vulva was then cleansed of the acetic acid with water. The patient tolerated the procedure well.   Adequate Exam: Yes  Biopsy Specimen: No  Condition: Stable. Patient tolerated procedure well.  Complications: None  Findings: Acetowhite changes of the clitoris.  No other changes on the vulva  Colposcopic Impression: VIN 2-3   LABORATORY AND RADIOLOGIC DATA: Outside medical records were reviewed to synthesize the above history, along with the history and physical obtained during the visit.  Outside laboratory, pathology reports were reviewed, with pertinent results below.   WBC  Date Value Ref Range Status  04/15/2023 5.1 3.4 - 10.8 x10E3/uL Final  04/10/2022 5.9 3.8 - 10.8 Thousand/uL Final   Hemoglobin  Date Value Ref Range Status  04/15/2023 13.3 11.1 - 15.9 g/dL Final   Hematocrit  Date Value Ref Range Status  04/15/2023 40.4 34.0 - 46.6 % Final   Platelets  Date Value Ref Range Status  04/15/2023 267 150 - 450 x10E3/uL Final   Creat  Date Value Ref Range Status  04/10/2022 0.76 0.50 - 1.03 mg/dL Final   Creatinine, Ser  Date Value Ref Range Status  04/15/2023 0.73 0.57 - 1.00 mg/dL Final   AST  Date Value Ref Range Status  04/15/2023 23 0 - 40 IU/L Final   ALT  Date Value Ref Range Status  04/15/2023 21 0 - 32 IU/L Final   Diagnosis  Date Value Ref Range Status  04/28/2019   Final   - Negative for intraepithelial lesion or malignancy (NILM)   Surgical pathology (02/24/24): Biopsy near clitoris: High-grade squamous intraepithelial lesion (VIN 2-3), HPV associated.  Positive for H SIL at lateral tissue edges

## 2024-03-11 ENCOUNTER — Encounter: Payer: Self-pay | Admitting: Obstetrics and Gynecology

## 2024-03-14 ENCOUNTER — Encounter: Payer: Self-pay | Admitting: Obstetrics and Gynecology

## 2024-03-15 ENCOUNTER — Other Ambulatory Visit: Payer: Self-pay | Admitting: Family Medicine

## 2024-03-15 DIAGNOSIS — F419 Anxiety disorder, unspecified: Secondary | ICD-10-CM

## 2024-03-15 NOTE — Telephone Encounter (Signed)
 Last OV: 04/15/2023 Last fill: 7/3/23025 90 capsule (90 day supply)    Front Office: Pls contact the pt to schedule appt with Dr. Alvia. Last seen 03/2023. Fasting labs. Thx

## 2024-04-13 ENCOUNTER — Other Ambulatory Visit: Payer: Self-pay | Admitting: Medical Genetics

## 2024-04-13 DIAGNOSIS — Z006 Encounter for examination for normal comparison and control in clinical research program: Secondary | ICD-10-CM

## 2024-04-14 ENCOUNTER — Other Ambulatory Visit: Payer: Self-pay | Admitting: Family Medicine

## 2024-04-14 DIAGNOSIS — F419 Anxiety disorder, unspecified: Secondary | ICD-10-CM

## 2024-04-21 ENCOUNTER — Encounter: Admitting: Family Medicine

## 2024-04-26 ENCOUNTER — Encounter: Payer: Self-pay | Admitting: Family Medicine

## 2024-04-26 ENCOUNTER — Ambulatory Visit (INDEPENDENT_AMBULATORY_CARE_PROVIDER_SITE_OTHER): Admitting: Family Medicine

## 2024-04-26 VITALS — BP 106/71 | HR 76 | Ht 67.0 in | Wt 155.0 lb

## 2024-04-26 DIAGNOSIS — R232 Flushing: Secondary | ICD-10-CM | POA: Diagnosis not present

## 2024-04-26 DIAGNOSIS — Z13220 Encounter for screening for lipoid disorders: Secondary | ICD-10-CM

## 2024-04-26 DIAGNOSIS — Z0000 Encounter for general adult medical examination without abnormal findings: Secondary | ICD-10-CM

## 2024-04-26 MED ORDER — SEMAGLUTIDE-WEIGHT MANAGEMENT 2.4 MG/0.75ML ~~LOC~~ SOAJ: 2.4000 mg | SUBCUTANEOUS | 0 refills | Status: DC

## 2024-04-26 MED ORDER — SEMAGLUTIDE-WEIGHT MANAGEMENT 0.25 MG/0.5ML ~~LOC~~ SOAJ: 0.2500 mg | SUBCUTANEOUS | 0 refills | Status: DC

## 2024-04-26 MED ORDER — SEMAGLUTIDE-WEIGHT MANAGEMENT 2.4 MG/0.75ML ~~LOC~~ SOAJ: 2.4000 mg | SUBCUTANEOUS | 0 refills | Status: AC

## 2024-04-26 MED ORDER — SEMAGLUTIDE-WEIGHT MANAGEMENT 1 MG/0.5ML ~~LOC~~ SOAJ: 1.0000 mg | SUBCUTANEOUS | 0 refills | Status: DC

## 2024-04-26 MED ORDER — SEMAGLUTIDE-WEIGHT MANAGEMENT 0.5 MG/0.5ML ~~LOC~~ SOAJ: 0.5000 mg | SUBCUTANEOUS | 0 refills | Status: DC

## 2024-04-26 MED ORDER — SEMAGLUTIDE-WEIGHT MANAGEMENT 0.5 MG/0.5ML ~~LOC~~ SOAJ: 0.5000 mg | SUBCUTANEOUS | 0 refills | Status: AC

## 2024-04-26 MED ORDER — SEMAGLUTIDE-WEIGHT MANAGEMENT 1 MG/0.5ML ~~LOC~~ SOAJ: 1.0000 mg | SUBCUTANEOUS | 0 refills | Status: AC

## 2024-04-26 MED ORDER — SEMAGLUTIDE-WEIGHT MANAGEMENT 0.25 MG/0.5ML ~~LOC~~ SOAJ: 0.2500 mg | SUBCUTANEOUS | 0 refills | Status: AC

## 2024-04-26 MED ORDER — SEMAGLUTIDE-WEIGHT MANAGEMENT 1.7 MG/0.75ML ~~LOC~~ SOAJ: 1.7000 mg | SUBCUTANEOUS | 0 refills | Status: DC

## 2024-04-26 MED ORDER — SEMAGLUTIDE-WEIGHT MANAGEMENT 1.7 MG/0.75ML ~~LOC~~ SOAJ: 1.7000 mg | SUBCUTANEOUS | 0 refills | Status: AC

## 2024-04-26 NOTE — Patient Instructions (Signed)
 Preventive Care 58-59 Years Old, Female  Preventive care refers to lifestyle choices and visits with your health care provider that can promote health and wellness. Preventive care visits are also called wellness exams.  What can I expect for my preventive care visit?  Counseling  Your health care provider may ask you questions about your:  Medical history, including:  Past medical problems.  Family medical history.  Pregnancy history.  Current health, including:  Menstrual cycle.  Method of birth control.  Emotional well-being.  Home life and relationship well-being.  Sexual activity and sexual health.  Lifestyle, including:  Alcohol, nicotine or tobacco, and drug use.  Access to firearms.  Diet, exercise, and sleep habits.  Work and work Astronomer.  Sunscreen use.  Safety issues such as seatbelt and bike helmet use.  Physical exam  Your health care provider will check your:  Height and weight. These may be used to calculate your BMI (body mass index). BMI is a measurement that tells if you are at a healthy weight.  Waist circumference. This measures the distance around your waistline. This measurement also tells if you are at a healthy weight and may help predict your risk of certain diseases, such as type 2 diabetes and high blood pressure.  Heart rate and blood pressure.  Body temperature.  Skin for abnormal spots.  What immunizations do I need?    Vaccines are usually given at various ages, according to a schedule. Your health care provider will recommend vaccines for you based on your age, medical history, and lifestyle or other factors, such as travel or where you work.  What tests do I need?  Screening  Your health care provider may recommend screening tests for certain conditions. This may include:  Lipid and cholesterol levels.  Diabetes screening. This is done by checking your blood sugar (glucose) after you have not eaten for a while (fasting).  Pelvic exam and Pap test.  Hepatitis B test.  Hepatitis C  test.  HIV (human immunodeficiency virus) test.  STI (sexually transmitted infection) testing, if you are at risk.  Lung cancer screening.  Colorectal cancer screening.  Mammogram. Talk with your health care provider about when you should start having regular mammograms. This may depend on whether you have a family history of breast cancer.  BRCA-related cancer screening. This may be done if you have a family history of breast, ovarian, tubal, or peritoneal cancers.  Bone density scan. This is done to screen for osteoporosis.  Talk with your health care provider about your test results, treatment options, and if necessary, the need for more tests.  Follow these instructions at home:  Eating and drinking    Eat a diet that includes fresh fruits and vegetables, whole grains, lean protein, and low-fat dairy products.  Take vitamin and mineral supplements as recommended by your health care provider.  Do not drink alcohol if:  Your health care provider tells you not to drink.  You are pregnant, may be pregnant, or are planning to become pregnant.  If you drink alcohol:  Limit how much you have to 0-1 drink a day.  Know how much alcohol is in your drink. In the [place] one drink equals one 12 oz bottle of beer (355 mL), one 5 oz glass of wine (148 mL), or one 1 oz glass of hard liquor (44 mL).  Lifestyle  Brush your teeth every morning and night with fluoride toothpaste. Floss one time each day.  Exercise for at least  30 minutes 5 or more days each week.  Do not use any products that contain nicotine or tobacco. These products include cigarettes, chewing tobacco, and vaping devices, such as e-cigarettes. If you need help quitting, ask your health care provider.  Do not use drugs.  If you are sexually active, practice safe sex. Use a condom or other form of protection to prevent STIs.  If you do not wish to become pregnant, use a form of birth control. If you plan to become pregnant, see your health care provider for a  prepregnancy visit.  Take aspirin only as told by your health care provider. Make sure that you understand how much to take and what form to take. Work with your health care provider to find out whether it is safe and beneficial for you to take aspirin daily.  Find healthy ways to manage stress, such as:  Meditation, yoga, or listening to music.  Journaling.  Talking to a trusted person.  Spending time with friends and family.  Minimize exposure to UV radiation to reduce your risk of skin cancer.  Safety  Always wear your seat belt while driving or riding in a vehicle.  Do not drive:  If you have been drinking alcohol. Do not ride with someone who has been drinking.  When you are tired or distracted.  While texting.  If you have been using any mind-altering substances or drugs.  Wear a helmet and other protective equipment during sports activities.  If you have firearms in your house, make sure you follow all gun safety procedures.  Seek help if you have been physically or sexually abused.  What's next?  Visit your health care provider once a year for an annual wellness visit.  Ask your health care provider how often you should have your eyes and teeth checked.  Stay up to date on all vaccines.  This information is not intended to replace advice given to you by your health care provider. Make sure you discuss any questions you have with your health care provider.  Document Revised: 11/28/2020 Document Reviewed: 11/28/2020  Elsevier Patient Education  2024 [employer].

## 2024-04-26 NOTE — Assessment & Plan Note (Signed)
 Well adult Orders Placed This Encounter  Procedures   CMP14+EGFR   CBC with Differential/Platelet   Lipid Panel With LDL/HDL Ratio   HgB A1c   Vitamin D  (25 hydroxy)   TSH  Screenings: Per lab orders Immunizations: She will get flu vaccine at work. Anticipatory guidance/risk factor reduction: Recommendations per AVS.

## 2024-04-26 NOTE — Progress Notes (Signed)
 Melissa Warner - 57 y.o. female MRN [id number]  Date of birth: April 24, 1966  Subjective Chief Complaint  Patient presents with   Weight Loss   Anxiety    HPI Melissa Warner is a 58 y.o. female here today for annual exam. No Known Allergies  She reports that she is doing well.   She would like to resume Wegovy .  She has tried to maintain weight through diet and exercise alone but continues to have difficulty with this.   She is moderately active.  She feels that diet is pretty good.   She is a non-smoker. Occasional EtOH.   Review of Systems  Constitutional:  Negative for chills, fever, malaise/fatigue and weight loss.  HENT:  Negative for congestion, ear pain and sore throat.   Eyes:  Negative for blurred vision, double vision and pain.  Respiratory:  Negative for cough and shortness of breath.   Cardiovascular:  Negative for chest pain and palpitations.  Gastrointestinal:  Negative for abdominal pain, blood in stool, constipation, heartburn and nausea.  Genitourinary:  Negative for dysuria and urgency.  Musculoskeletal:  Negative for joint pain and myalgias.  Neurological:  Negative for dizziness and headaches.  Endo/Heme/Allergies:  Does not bruise/bleed easily.  Psychiatric/Behavioral:  Negative for depression. The patient is not nervous/anxious and does not have insomnia.      Past Medical History:  Diagnosis Date   Anxiety    Arthritis    Depression    GERD (gastroesophageal reflux disease)    Lyme disease     Past Surgical History:  Procedure Laterality Date   BREAST EXCISIONAL BIOPSY Right 2009   benign   BREAST SURGERY     Benign biopsy 2009   CESAREAN SECTION     FRACTURE SURGERY  08/13/2021   Knee meniscus tear repair   TUBAL LIGATION  02/2005    Social History   Socioeconomic History   Marital status: Married    Spouse name: Georgeanne Frankland   Number of children: Not on file   Years of education: Not on file   Highest education level: Master's degree (e.g., MA,  MS, MEng, MEd, MSW, MBA)  Occupational History   Not on file  Tobacco Use   Smoking status: Never    Passive exposure: Never   Smokeless tobacco: Never  Vaping Use   Vaping status: Never Used  Substance and Sexual Activity   Alcohol use: Yes    Alcohol/week: 7.0 standard drinks of alcohol    Types: 7 Glasses of wine per week   Drug use: Never   Sexual activity: Yes    Partners: Male    Birth control/protection: Post-menopausal  Other Topics Concern   Not on file  Social History Narrative   Not on file   Social Drivers of Health   Financial Resource Strain: Low Risk  (04/20/2024)   Overall Financial Resource Strain (CARDIA)    Difficulty of Paying Living Expenses: Not hard at all  Food Insecurity: No Food Insecurity (04/20/2024)   Hunger Vital Sign    Worried About Running Out of Food in the Last Year: Never true    Ran Out of Food in the Last Year: Never true  Transportation Needs: No Transportation Needs (04/20/2024)   PRAPARE - Administrator, Civil Service (Medical): No    Lack of Transportation (Non-Medical): No  Physical Activity: Sufficiently Active (04/20/2024)   Exercise Vital Sign    Days of Exercise per Week: 3 days    Minutes of Exercise  per Session: 60 min  Stress: No Stress Concern Present (04/20/2024)   [employer] of Occupational Health - Occupational Stress Questionnaire    Feeling of Stress: Only a little  Social Connections: Socially Integrated (04/20/2024)   Social Connection and Isolation Panel    Frequency of Communication with Friends and Family: More than three times a week    Frequency of Social Gatherings with Friends and Family: Three times a week    Attends Religious Services: 1 to 4 times per year    Active Member of Clubs or Organizations: Yes    Attends Banker Meetings: 1 to 4 times per year    Marital Status: Married    Family History  Problem Relation Age of Onset   Breast cancer Mother 81   Cancer Mother     Pancreatic cancer Brother    Cancer Brother    Breast cancer Maternal Aunt    Colon cancer Neg Hx    Esophageal cancer Neg Hx    Stomach cancer Neg Hx    Rectal cancer Neg Hx    Ovarian cancer Neg Hx    Endometrial cancer Neg Hx    Prostate cancer Neg Hx     Health Maintenance  Topic Date Due   HIV Screening  Never done   Hepatitis C Screening  Never done   Pneumococcal Vaccine: 50+ Years (1 of 2 - PCV) 04/26/2025 (Originally 08/19/1983)   Hepatitis B Vaccines 19-59 Average Risk (1 of 3 - 19+ 3-dose series) 04/26/2025 (Originally 08/19/1983)   COVID-19 Vaccine (4 - 2025-26 season) 05/12/2025 (Originally 02/15/2024)   Mammogram  12/21/2025   Cervical Cancer Screening (HPV/Pap Cotest)  02/24/2027   DTaP/Tdap/Td (3 - Td or Tdap) 06/16/2029   Colonoscopy  08/25/2029   Influenza Vaccine  Completed   Zoster Vaccines- Shingrix  Completed   HPV VACCINES  Aged Out   Meningococcal B Vaccine  Aged Out     ----------------------------------------------------------------------------------------------------------------------------------------------------------------------------------------------------------------- Physical Exam BP 106/71 (BP Location: Left Arm, Patient Position: Sitting, Cuff Size: Normal)   Pulse 76   Ht 5' 7 (1.702 m)   Wt 155 lb (70.3 kg)   SpO2 99%   BMI 24.28 kg/m   Physical Exam Constitutional:      General: She is not in acute distress. HENT:     Head: Normocephalic and atraumatic.     Right Ear: Tympanic membrane and ear canal normal.     Left Ear: Tympanic membrane and ear canal normal.     Nose: Nose normal.  Eyes:     General: No scleral icterus.    Conjunctiva/sclera: Conjunctivae normal.  Neck:     Thyroid: No thyromegaly.  Cardiovascular:     Rate and Rhythm: Normal rate and regular rhythm.     Heart sounds: Normal heart sounds.  Pulmonary:     Effort: Pulmonary effort is normal.     Breath sounds: Normal breath sounds.  Abdominal:      General: Bowel sounds are normal. There is no distension.     Palpations: Abdomen is soft.     Tenderness: There is no abdominal tenderness. There is no guarding.  Musculoskeletal:        General: Normal range of motion.     Cervical back: Normal range of motion and neck supple.  Lymphadenopathy:     Cervical: No cervical adenopathy.  Skin:    General: Skin is warm and dry.     Findings: No rash.  Neurological:  General: No focal deficit present.     Mental Status: She is alert and oriented to person, place, and time.     Cranial Nerves: No cranial nerve deficit.     Coordination: Coordination normal.  Psychiatric:        Mood and Affect: Mood normal.        Behavior: Behavior normal.     ------------------------------------------------------------------------------------------------------------------------------------------------------------------------------------------------------------------- Assessment and Plan  Well adult exam Well adult Orders Placed This Encounter  Procedures   CMP14+EGFR   CBC with Differential/Platelet   Lipid Panel With LDL/HDL Ratio   HgB A1c   Vitamin D  (25 hydroxy)   TSH  Screenings: Per lab orders Immunizations: She will get flu vaccine at work. Anticipatory guidance/risk factor reduction: Recommendations per AVS.   Meds ordered this encounter  Medications   DISCONTD: semaglutide -weight management (WEGOVY ) 0.25 MG/0.5ML SOAJ SQ injection    Sig: Inject 0.25 mg into the skin once a week for 28 days.    Dispense:  2 mL    Refill:  0   DISCONTD: semaglutide -weight management (WEGOVY ) 0.5 MG/0.5ML SOAJ SQ injection    Sig: Inject 0.5 mg into the skin once a week for 28 days.    Dispense:  2 mL    Refill:  0   DISCONTD: semaglutide -weight management (WEGOVY ) 1 MG/0.5ML SOAJ SQ injection    Sig: Inject 1 mg into the skin once a week for 28 days.    Dispense:  2 mL    Refill:  0   DISCONTD: semaglutide -weight management (WEGOVY ) 1.7  MG/0.75ML SOAJ SQ injection    Sig: Inject 1.7 mg into the skin once a week for 28 days.    Dispense:  3 mL    Refill:  0   DISCONTD: semaglutide -weight management (WEGOVY ) 2.4 MG/0.75ML SOAJ SQ injection    Sig: Inject 2.4 mg into the skin once a week for 28 days.    Dispense:  3 mL    Refill:  0   semaglutide -weight management (WEGOVY ) 0.5 MG/0.5ML SOAJ SQ injection    Sig: Inject 0.5 mg into the skin once a week for 28 days.    Dispense:  2 mL    Refill:  0   semaglutide -weight management (WEGOVY ) 1 MG/0.5ML SOAJ SQ injection    Sig: Inject 1 mg into the skin once a week for 28 days.    Dispense:  2 mL    Refill:  0   semaglutide -weight management (WEGOVY ) 1.7 MG/0.75ML SOAJ SQ injection    Sig: Inject 1.7 mg into the skin once a week for 28 days.    Dispense:  3 mL    Refill:  0   semaglutide -weight management (WEGOVY ) 2.4 MG/0.75ML SOAJ SQ injection    Sig: Inject 2.4 mg into the skin once a week for 28 days.    Dispense:  3 mL    Refill:  0   semaglutide -weight management (WEGOVY ) 0.25 MG/0.5ML SOAJ SQ injection    Sig: Inject 0.25 mg into the skin once a week for 28 days.    Dispense:  2 mL    Refill:  0    No follow-ups on file.

## 2024-04-27 LAB — LIPID PANEL WITH LDL/HDL RATIO
Cholesterol, Total: 176 mg/dL (ref 100–199)
HDL: 82 mg/dL (ref >39)
LDL Chol Calc (NIH): 81 mg/dL (ref 0–99)
LDL/HDL Ratio: 1 ratio (ref 0.0–3.2)
Triglycerides: 67 mg/dL (ref 0–149)
VLDL Cholesterol Cal: 13 mg/dL (ref 5–40)

## 2024-04-27 LAB — VITAMIN D 25 HYDROXY (VIT D DEFICIENCY, FRACTURES): Vit D, 25-Hydroxy: 82.2 ng/mL (ref 30.0–100.0)

## 2024-04-27 LAB — CBC WITH DIFFERENTIAL/PLATELET
Basophils Absolute: 0 x10E3/uL (ref 0.0–0.2)
Basos: 1 %
EOS (ABSOLUTE): 0.1 x10E3/uL (ref 0.0–0.4)
Eos: 2 %
Hematocrit: 39.3 % (ref 34.0–46.6)
Hemoglobin: 12.8 g/dL (ref 11.1–15.9)
Immature Grans (Abs): 0 x10E3/uL (ref 0.0–0.1)
Immature Granulocytes: 0 %
Lymphocytes Absolute: 1.7 x10E3/uL (ref 0.7–3.1)
Lymphs: 34 %
MCH: 30.8 pg (ref 26.6–33.0)
MCHC: 32.6 g/dL (ref 31.5–35.7)
MCV: 95 fL (ref 79–97)
Monocytes Absolute: 0.4 x10E3/uL (ref 0.1–0.9)
Monocytes: 9 %
Neutrophils Absolute: 2.6 x10E3/uL (ref 1.4–7.0)
Neutrophils: 54 %
Platelets: 276 x10E3/uL (ref 150–450)
RBC: 4.16 x10E6/uL (ref 3.77–5.28)
RDW: 12.2 % (ref 11.7–15.4)
WBC: 4.8 x10E3/uL (ref 3.4–10.8)

## 2024-04-27 LAB — CMP14+EGFR
ALT: 16 IU/L (ref 0–32)
AST: 21 IU/L (ref 0–40)
Albumin: 4.5 g/dL (ref 3.8–4.9)
Alkaline Phosphatase: 67 IU/L (ref 49–135)
BUN/Creatinine Ratio: 16 (ref 9–23)
BUN: 13 mg/dL (ref 6–24)
Bilirubin Total: 0.4 mg/dL (ref 0.0–1.2)
CO2: 25 mmol/L (ref 20–29)
Calcium: 9.9 mg/dL (ref 8.7–10.2)
Chloride: 102 mmol/L (ref 96–106)
Creatinine, Ser: 0.81 mg/dL (ref 0.57–1.00)
Globulin, Total: 2.1 g/dL (ref 1.5–4.5)
Glucose: 94 mg/dL (ref 70–99)
Potassium: 4.4 mmol/L (ref 3.5–5.2)
Sodium: 141 mmol/L (ref 134–144)
Total Protein: 6.6 g/dL (ref 6.0–8.5)
eGFR: 84 mL/min/1.73 (ref >59)

## 2024-04-27 LAB — HEMOGLOBIN A1C
Est. average glucose Bld gHb Est-mCnc: 114 mg/dL
Hgb A1c MFr Bld: 5.6 % (ref 4.8–5.6)

## 2024-04-27 LAB — TSH: TSH: 2.13 u[IU]/mL (ref 0.450–4.500)

## 2024-04-28 ENCOUNTER — Encounter: Payer: Self-pay | Admitting: *Deleted

## 2024-04-28 ENCOUNTER — Telehealth: Payer: Self-pay | Admitting: *Deleted

## 2024-04-28 NOTE — Telephone Encounter (Signed)
 Spoke with Melissa Warner who called to reschedule her appt. With Dr. Eldonna on Monday, 11/17. Pt states Monday's are not convenient, and she may need to see Dr. Eldonna at [facility] in [place]? Pt states the Aldara  cream has worked wonders and the lesion is gone. Advised patient that her message will be relayed to Dr. Eldonna.

## 2024-05-02 ENCOUNTER — Inpatient Hospital Stay: Admitting: Psychiatry

## 2024-05-02 ENCOUNTER — Telehealth: Payer: Self-pay | Admitting: Psychiatry

## 2024-05-02 NOTE — Telephone Encounter (Signed)
 Attempted to call patient to follow-up regarding her question of follow-up location. No answer. Generic VM left. Will try again at another time.

## 2024-05-03 ENCOUNTER — Telehealth: Payer: Self-pay | Admitting: Psychiatry

## 2024-05-03 NOTE — Telephone Encounter (Signed)
 Attempted to call pt again. No answer. Generic VM left. Will try again one final time.

## 2024-05-03 NOTE — Progress Notes (Signed)
 This encounter was created in error - please disregard.

## 2024-05-10 ENCOUNTER — Ambulatory Visit: Payer: Self-pay | Admitting: Family Medicine

## 2024-05-17 ENCOUNTER — Telehealth: Payer: Self-pay | Admitting: Psychiatry

## 2024-05-17 NOTE — Telephone Encounter (Signed)
 Called pt regarding not being able to come to clinic follow-up on Mondays due to work. Pt would like to follow back up with her gynecologist for follow-up. Feels that the lesion has resolved with Aldara . Discussed that this is reasonable. Would recommend q19mo exam for the first two years. Pt is in agreement with this. All questions answered.

## 2024-06-10 ENCOUNTER — Other Ambulatory Visit: Payer: Self-pay | Admitting: Family Medicine

## 2024-06-10 DIAGNOSIS — F419 Anxiety disorder, unspecified: Secondary | ICD-10-CM

## 2024-06-26 ENCOUNTER — Other Ambulatory Visit: Payer: Self-pay | Admitting: Family Medicine

## 2024-06-26 DIAGNOSIS — F419 Anxiety disorder, unspecified: Secondary | ICD-10-CM

## 2024-06-26 DIAGNOSIS — G4700 Insomnia, unspecified: Secondary | ICD-10-CM
